# Patient Record
Sex: Male | Born: 1935 | Race: White | Hispanic: No | Marital: Married | Smoking: Never smoker
Health system: Southern US, Community
[De-identification: ages and names within clinical notes are randomized; demographics above are authoritative.]

## PROBLEM LIST (undated history)

## (undated) DIAGNOSIS — Z87442 Personal history of urinary calculi: Secondary | ICD-10-CM

## (undated) DIAGNOSIS — R011 Cardiac murmur, unspecified: Secondary | ICD-10-CM

## (undated) DIAGNOSIS — M1990 Unspecified osteoarthritis, unspecified site: Secondary | ICD-10-CM

## (undated) DIAGNOSIS — I10 Essential (primary) hypertension: Secondary | ICD-10-CM

## (undated) DIAGNOSIS — N400 Enlarged prostate without lower urinary tract symptoms: Secondary | ICD-10-CM

## (undated) HISTORY — PX: BACK SURGERY: SHX140

---

## 1998-03-19 ENCOUNTER — Encounter: Admission: RE | Admit: 1998-03-19 | Discharge: 1998-03-19 | Payer: Self-pay | Admitting: Infectious Diseases

## 1999-05-21 ENCOUNTER — Ambulatory Visit (HOSPITAL_COMMUNITY): Admission: RE | Admit: 1999-05-21 | Discharge: 1999-05-21 | Payer: Self-pay | Admitting: Internal Medicine

## 2000-01-22 ENCOUNTER — Ambulatory Visit (HOSPITAL_BASED_OUTPATIENT_CLINIC_OR_DEPARTMENT_OTHER): Admission: RE | Admit: 2000-01-22 | Discharge: 2000-01-22 | Payer: Self-pay | Admitting: Anesthesiology

## 2000-01-22 ENCOUNTER — Encounter (INDEPENDENT_AMBULATORY_CARE_PROVIDER_SITE_OTHER): Payer: Self-pay | Admitting: Specialist

## 2001-08-30 ENCOUNTER — Encounter: Admission: RE | Admit: 2001-08-30 | Discharge: 2001-08-30 | Payer: Self-pay | Admitting: Internal Medicine

## 2001-08-30 ENCOUNTER — Encounter: Payer: Self-pay | Admitting: Internal Medicine

## 2001-10-23 ENCOUNTER — Ambulatory Visit (HOSPITAL_COMMUNITY): Admission: RE | Admit: 2001-10-23 | Discharge: 2001-10-23 | Payer: Self-pay | Admitting: Internal Medicine

## 2001-10-30 ENCOUNTER — Encounter: Payer: Self-pay | Admitting: Internal Medicine

## 2001-10-30 ENCOUNTER — Encounter: Admission: RE | Admit: 2001-10-30 | Discharge: 2001-10-30 | Payer: Self-pay | Admitting: Internal Medicine

## 2001-11-16 ENCOUNTER — Ambulatory Visit (HOSPITAL_COMMUNITY): Admission: RE | Admit: 2001-11-16 | Discharge: 2001-11-16 | Payer: Self-pay | Admitting: Internal Medicine

## 2004-04-21 ENCOUNTER — Encounter: Admission: RE | Admit: 2004-04-21 | Discharge: 2004-04-21 | Payer: Self-pay | Admitting: Internal Medicine

## 2005-01-05 ENCOUNTER — Ambulatory Visit (HOSPITAL_COMMUNITY): Admission: RE | Admit: 2005-01-05 | Discharge: 2005-01-05 | Payer: Self-pay | Admitting: Gastroenterology

## 2008-09-23 ENCOUNTER — Emergency Department (HOSPITAL_COMMUNITY): Admission: EM | Admit: 2008-09-23 | Discharge: 2008-09-23 | Payer: Self-pay | Admitting: Emergency Medicine

## 2008-09-25 ENCOUNTER — Encounter: Admission: RE | Admit: 2008-09-25 | Discharge: 2008-09-25 | Payer: Self-pay | Admitting: Orthopedic Surgery

## 2008-10-16 ENCOUNTER — Ambulatory Visit (HOSPITAL_COMMUNITY): Admission: RE | Admit: 2008-10-16 | Discharge: 2008-10-16 | Payer: Self-pay | Admitting: Orthopedic Surgery

## 2008-10-21 ENCOUNTER — Ambulatory Visit (HOSPITAL_COMMUNITY): Admission: RE | Admit: 2008-10-21 | Discharge: 2008-10-21 | Payer: Self-pay | Admitting: Orthopedic Surgery

## 2009-05-19 ENCOUNTER — Ambulatory Visit (HOSPITAL_COMMUNITY): Admission: RE | Admit: 2009-05-19 | Discharge: 2009-05-19 | Payer: Self-pay | Admitting: General Surgery

## 2009-06-05 ENCOUNTER — Encounter (INDEPENDENT_AMBULATORY_CARE_PROVIDER_SITE_OTHER): Payer: Self-pay | Admitting: General Surgery

## 2009-06-05 ENCOUNTER — Ambulatory Visit (HOSPITAL_COMMUNITY): Admission: RE | Admit: 2009-06-05 | Discharge: 2009-06-05 | Payer: Self-pay | Admitting: General Surgery

## 2010-02-12 IMAGING — CR DG CHEST 2V
2 series · 2 of 2 positions shown · non-contrast
Comparison: None

CLINICAL DATA: Preop

CHEST - 2 VIEW

[view not recorded (1 of 2)]
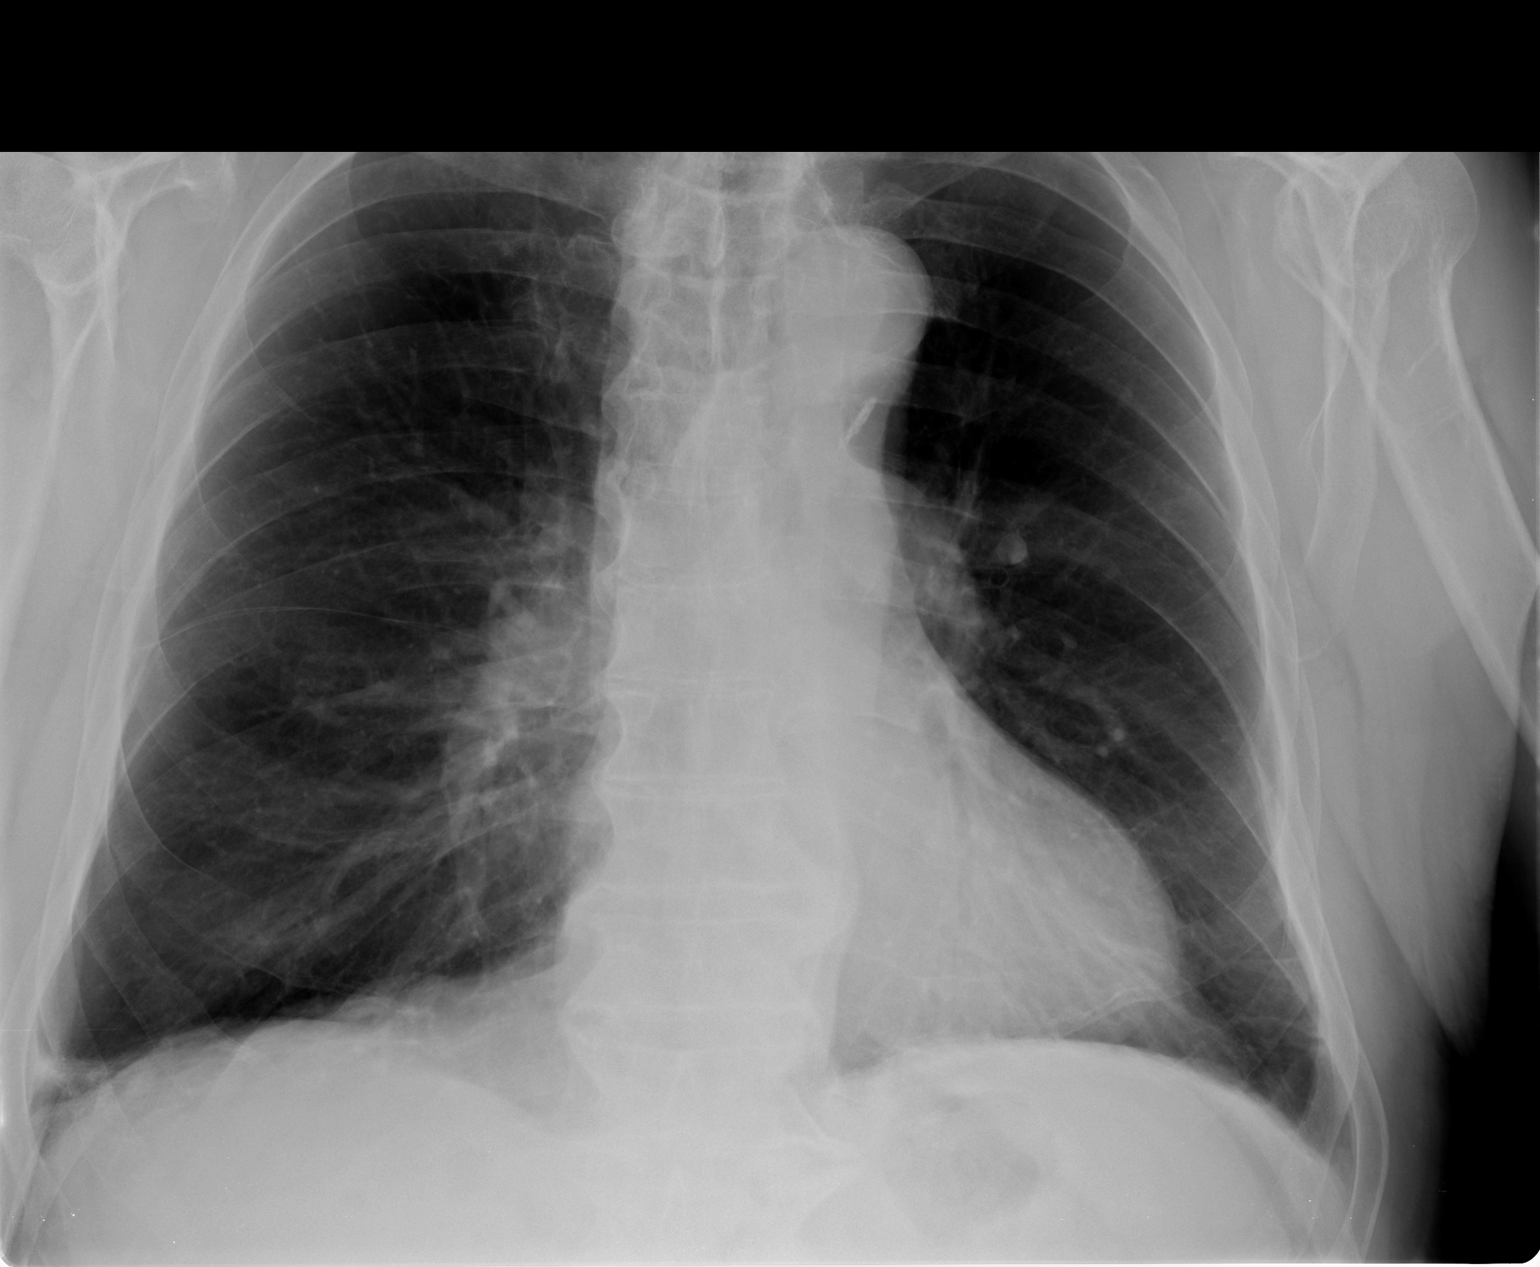

[view not recorded (2 of 2)]
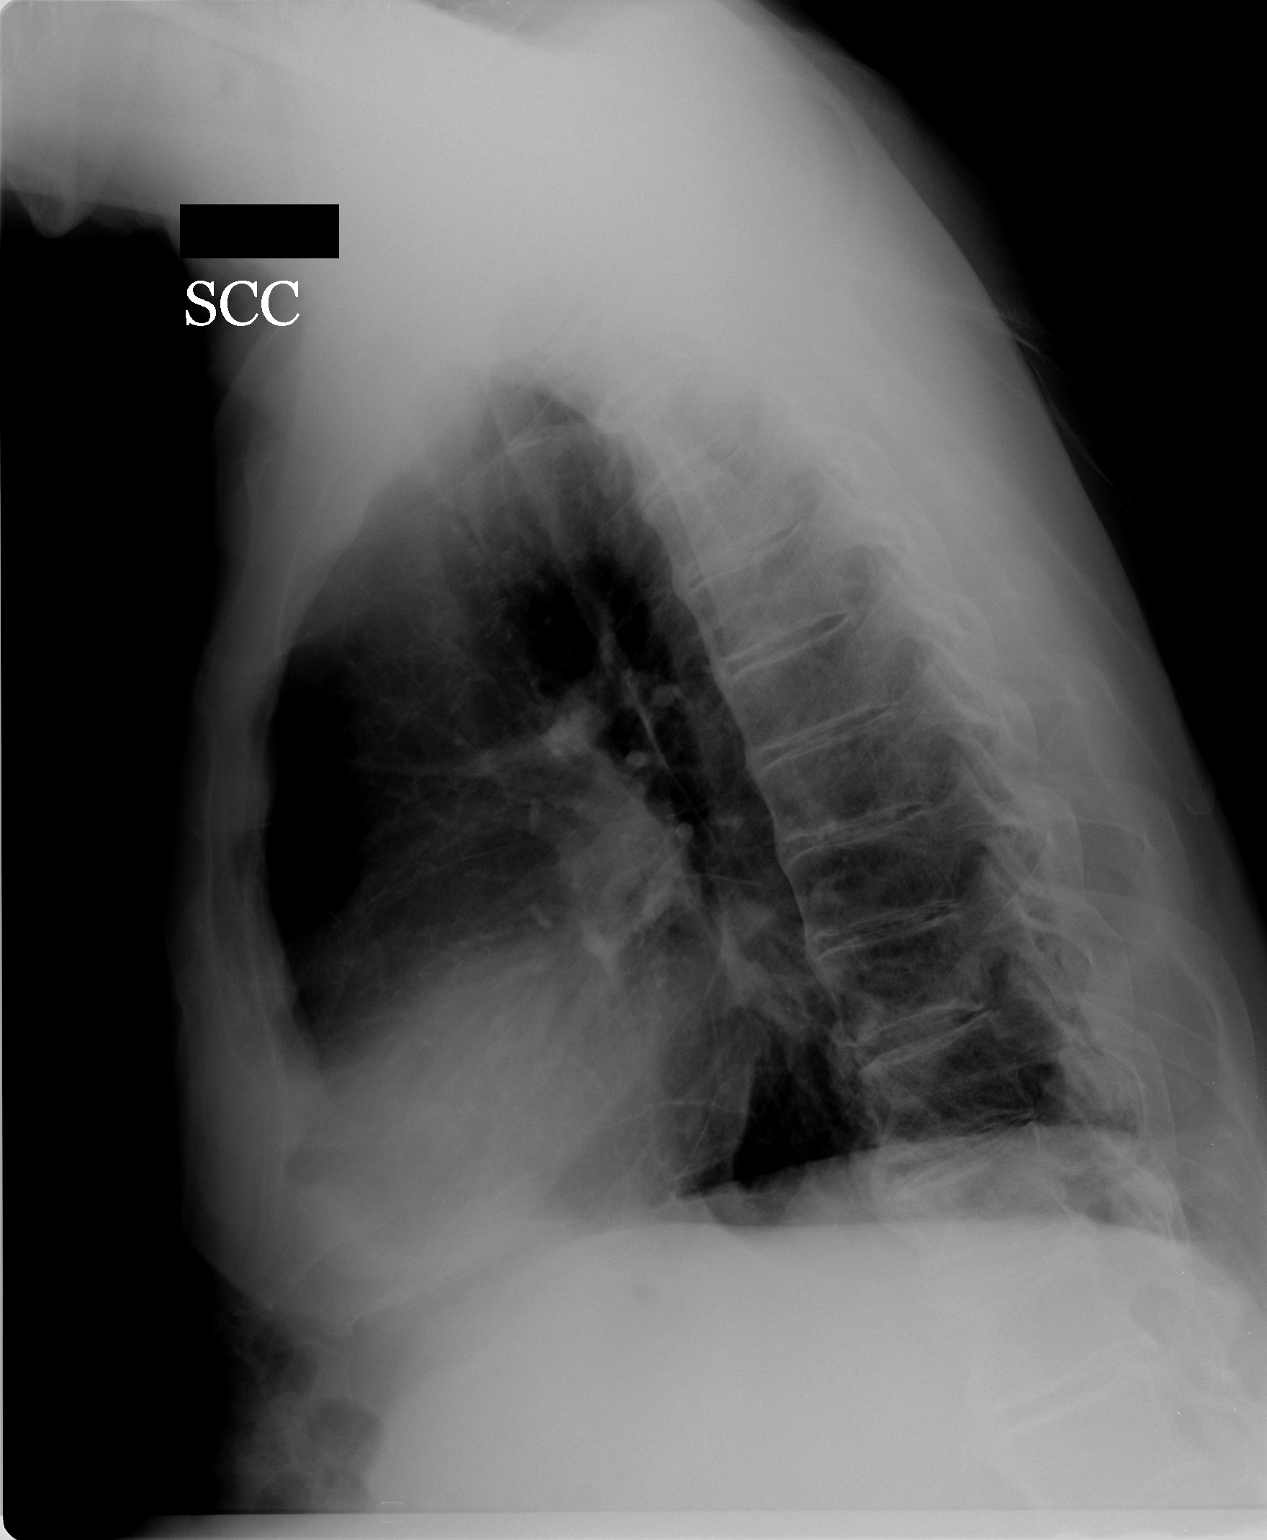

[2 of 2 positions shown; findings below may reference images not displayed]

FINDINGS: Cardiomediastinal silhouette is unremarkable.  No acute
infiltrate or pleural effusion.  No pulmonary edema.  Degenerative
changes thoracic spine.
IMPRESSION: No active disease.  Degenerative changes thoracic spine.

## 2010-09-06 ENCOUNTER — Encounter: Payer: Self-pay | Admitting: Orthopedic Surgery

## 2010-09-06 ENCOUNTER — Encounter: Payer: Self-pay | Admitting: Internal Medicine

## 2010-11-19 LAB — URINE MICROSCOPIC-ADD ON

## 2010-11-19 LAB — DIFFERENTIAL
Eosinophils Relative: 7 % — ABNORMAL HIGH (ref 0–5)
Lymphocytes Relative: 20 % (ref 12–46)
Lymphs Abs: 1.6 10*3/uL (ref 0.7–4.0)
Neutro Abs: 4.8 10*3/uL (ref 1.7–7.7)

## 2010-11-19 LAB — URINALYSIS, ROUTINE W REFLEX MICROSCOPIC
Glucose, UA: NEGATIVE mg/dL
Specific Gravity, Urine: 1.011 (ref 1.005–1.030)
pH: 7.5 (ref 5.0–8.0)

## 2010-11-19 LAB — CBC
MCHC: 34 g/dL (ref 30.0–36.0)
MCV: 88.8 fL (ref 78.0–100.0)
Platelets: 205 10*3/uL (ref 150–400)
RBC: 5.35 MIL/uL (ref 4.22–5.81)

## 2010-11-19 LAB — COMPREHENSIVE METABOLIC PANEL
AST: 23 U/L (ref 0–37)
CO2: 33 mEq/L — ABNORMAL HIGH (ref 19–32)
Calcium: 9.3 mg/dL (ref 8.4–10.5)
Creatinine, Ser: 0.91 mg/dL (ref 0.4–1.5)
GFR calc Af Amer: >60 mL/min (ref >60)
GFR calc non Af Amer: >60 mL/min (ref >60)

## 2010-11-26 LAB — CREATININE, SERUM
Creatinine, Ser: 0.85 mg/dL (ref 0.4–1.5)
GFR calc Af Amer: >60 mL/min (ref >60)

## 2011-01-01 NOTE — Op Note (Signed)
[place]  Patient:    Gilbert Morse, Gilbert Morse                     MRN: [id number] Proc. Date: 01/22/00 Adm. Date:  [id number] Disc. Date: [id number] Attending:  Melody Comas.                           Operative Report  PREOPERATIVE DIAGNOSIS:  A 1.5 cm nodular growth of the left palm (probable pyogenic granuloma).  POSTOPERATIVE DIAGNOSIS:  A 1.5 cm nodular growth of the left palm (probable pyogenic granuloma).  OPERATION PERFORMED:  Excisional biopsy of 1.5 cm nodular mass of the left palm.  SURGEON:  Janet Berlin. Dan Humphreys, M.D.  ANESTHESIA:  Local.  INDICATIONS FOR PROCEDURE:  Gilbert Morse is a 77 year old man who sustained a rather trivial episode of trauma to his hand.  The wound very quickly developed exuberant growth of what appears to be granulation tissue, consistent with a pyogenic granuloma.  This has markedly interfered with his ability to use his hand.  He was taken to the operating room at this time for removal of the lesion.  DESCRIPTION OF PROCEDURE:  The patient was brought to the minor surgery operating room in the [facility] and placed on the table in supine position with his arm extended out away from his side.  A padded tourniquet was applied to the forearm.  I first infiltrated the soft tissues of the palm around the lesion with 1% Xylocaine containing epinephrine.  The operative field was then vigorously scrubbed with Hibiclens solution and sterilely draped.  The tourniquet was then inflated to 250 mmHg where it remained up for approximately 10 minutes.  I first simply amputated the lesion through the base of its stalk.  The specimen was passed off the operative field for pathologic analysis.  I then excised the base of the lesion in fusiform fashion, full thickness.  The wound was copiously irrigated out with saline solution.  I then closed the wound with interrupted sutures of 4-0 nylon in both simple and  vertical mattress fashions.  The tourniquet was then deflated.  There was a little bit of oozing around the sutures which was controlled with digital pressure for a couple of minutes.  I then dressed the wound with bacitracin ointment, a small piece of Xeroform gauze, surgical fluff gauze and a Kling wrap.  The patient was given detailed wound care instructions.  I will see him in the office next week for wound check, suture removal and review of his final pathology report.  He was also given a prescription for Keflex 250 mg p.o. qid x 5 days. DD:  01/22/00 TD:  01/26/00 Job: [id number]

## 2011-01-01 NOTE — Op Note (Signed)
NAME:  Gilbert Morse, Gilbert Morse NO.:  [id number]   MEDICAL RECORD NO.:  [id number]          PATIENT TYPE:  AMB   LOCATION:  ENDO                         FACILITY:  [facility]   PHYSICIAN:  Danise Edge, M.D.   DATE OF BIRTH:  Apr 11, 1931   DATE OF PROCEDURE:  01/05/2005  DATE OF DISCHARGE:                                 OPERATIVE REPORT   PROCEDURE:  Colonoscopy.   INDICATIONS:  Mr. Gilbert Morse is a 77 year old male born [birth year].  Mr. Gilbert Morse is scheduled to undergo his first screening colonoscopy with  polypectomy to prevent colon cancer.   ENDOSCOPIST:  Danise Edge, M.D.   PREMEDICATION:  Versed 6 mg, Demerol 60 mg.   DESCRIPTION OF PROCEDURE:  After obtaining informed consent, Mr. Gilbert Morse was  placed in the left lateral decubitus position. I administered intravenous  Demerol and intravenous Versed to achieve conscious sedation for the  procedure. The patient's blood pressure, oxygen saturation and cardiac  rhythm were monitored throughout the procedure and documented in the medical  record.   Anal inspection was normal. Digital rectal exam reveals a nonnodular  prostate. The Olympus adjustable pediatric colonoscope was introduced into  the rectum and advanced to the cecum. A normal-appearing appendiceal orifice  and ileocecal valve were identified. Colonic preparation for the exam today  was satisfactory.   Mr. Gilbert Morse has universal colonic diverticulosis without diverticulitis or  diverticular stricture formation.   RECTUM:  Normal. Retroflexed view of the distal rectum normal.  SIGMOID COLON AND DESCENDING COLON:  Normal.  SPLENIC FLEXURE:  Normal.  TRANSVERSE COLON:  Normal.  HEPATIC FLEXURE:  Normal.  ASCENDING COLON:  Normal.  CECUM AND ILEOCECAL VALVE:  Normal.   ASSESSMENT:  Normal screening proctocolonoscopy to the cecum. No endoscopic  evidence for the presence of colorectal neoplasia. Universal colonic  diverticulosis noted.      MJ/MEDQ  D:  01/05/2005  T:  01/05/2005  Job:  [id number]   cc:   Erskine Speed, M.D.  [address], [address]  [place]  [place] [address]  Fax: [phone]

## 2011-11-17 ENCOUNTER — Other Ambulatory Visit (HOSPITAL_COMMUNITY): Payer: Self-pay | Admitting: Internal Medicine

## 2011-11-17 DIAGNOSIS — R079 Chest pain, unspecified: Secondary | ICD-10-CM

## 2011-11-22 ENCOUNTER — Ambulatory Visit (HOSPITAL_COMMUNITY): Payer: Medicare Other | Attending: Cardiology | Admitting: Radiology

## 2011-11-22 VITALS — BP 176/86 | Ht 72.0 in | Wt 194.0 lb

## 2011-11-22 DIAGNOSIS — R0602 Shortness of breath: Secondary | ICD-10-CM | POA: Insufficient documentation

## 2011-11-22 DIAGNOSIS — R002 Palpitations: Secondary | ICD-10-CM | POA: Insufficient documentation

## 2011-11-22 DIAGNOSIS — I10 Essential (primary) hypertension: Secondary | ICD-10-CM | POA: Insufficient documentation

## 2011-11-22 DIAGNOSIS — R079 Chest pain, unspecified: Secondary | ICD-10-CM | POA: Insufficient documentation

## 2011-11-22 MED ORDER — REGADENOSON 0.4 MG/5ML IV SOLN
0.4000 mg | Freq: Once | INTRAVENOUS | Status: AC
  Administered 2011-11-22: 0.4 mg via INTRAVENOUS

## 2011-11-22 MED ORDER — TECHNETIUM TC 99M TETROFOSMIN IV KIT
10.0000 | PACK | Freq: Once | INTRAVENOUS | Status: AC | PRN
  Administered 2011-11-22: 10 via INTRAVENOUS

## 2011-11-22 MED ORDER — TECHNETIUM TC 99M TETROFOSMIN IV KIT
30.0000 | PACK | Freq: Once | INTRAVENOUS | Status: AC | PRN
  Administered 2011-11-22: 30 via INTRAVENOUS

## 2011-11-22 NOTE — Progress Notes (Signed)
[facility] SITE 3 NUCLEAR MED [address] [place] [address] [phone]  Cardiology Nuclear Med Study  Gilbert Morse is a 78 y.o. male     MRN : [id number]     DOB: Sep 03, 1930  Procedure Date: 11/22/2011  Nuclear Med Background Indication for Stress Test:  Evaluation for Ischemia History: 2003: ECHO: EF: 55-65% Cardiac Risk Factors: Hypertension  Symptoms:  Chest Pain, Palpitations and SOB   Nuclear Pre-Procedure Caffeine/Decaff Intake:  None NPO After: 9:30pm   Lungs:  clear O2 Sat: 95% on room air. IV 0.9% NS with Angio Cath:  20g  IV Site: R Antecubital  IV Started by:  Stanton Kidney, EMT-P  Chest Size (in):  42 Cup Size: n/a  Height: 6' (1.829 m)  Weight:  194 lb (87.998 kg)  BMI:  Body mass index is 26.31 kg/(m^2). Tech Comments:  Coreg held > 24 hours, per patient.    Nuclear Med Study 1 or 2 day study: 1 day  Stress Test Type:  Eugenie Birks  Reading MD: Willa Rough, MD  Order Authorizing Provider:  ED GREEN MD  Resting Radionuclide: Technetium 89m Tetrofosmin  Resting Radionuclide Dose: 11.0 mCi   Stress Radionuclide:  Technetium 50m Tetrofosmin  Stress Radionuclide Dose: 32.9 mCi           Stress Protocol Rest HR: 57 Stress HR: 75  Rest BP: 176/86 Stress BP: 151/83  Exercise Time (min): n/a METS: n/a   Predicted Max HR: 142 bpm % Max HR: 52.82 bpm Rate Pressure Product: 41324   Dose of Adenosine (mg):  n/a Dose of Lexiscan: 0.4 mg  Dose of Atropine (mg): n/a Dose of Dobutamine: n/a mcg/kg/min (at max HR)  Stress Test Technologist: Milana Na, EMT-P  Nuclear Technologist:  Domenic Polite, CNMT     Rest Procedure:  Myocardial perfusion imaging was performed at rest 45 minutes following the intravenous administration of Technetium 13m Tetrofosmin. Rest ECG: NSR - Normal EKG  Stress Procedure:  The patient received IV Lexiscan 0.4 mg over 15-seconds.  Technetium 55m Tetrofosmin injected at 30-seconds.  There were no significant  changes and rare pacs with Lexiscan.  Quantitative spect images were obtained after a 45 minute delay. Stress ECG: No significant change from baseline ECG and There are scattered PACs.  QPS Raw Data Images:  Patient motion noted; appropriate software correction applied. Stress Images:  Normal homogeneous uptake in all areas of the myocardium. Rest Images:  Normal homogeneous uptake in all areas of the myocardium. Subtraction (SDS):  No evidence of ischemia. Transient Ischemic Dilatation (Normal <1.22):  0.97 Lung/Heart Ratio (Normal <0.45):  0.31  Quantitative Gated Spect Images QGS EDV:  141 ml QGS ESV:  57 ml  Impression Exercise Capacity:  Lexiscan with no exercise. BP Response:  Normal blood pressure response. Clinical Symptoms:  SOB ECG Impression:  No significant ST segment change suggestive of ischemia. Comparison with Prior Nuclear Study: No previous nuclear study performed  Overall Impression:  Normal stress nuclear study.  LV Ejection Fraction: 59%.  LV Wall Motion:  Normal Wall Motion  Willa Rough, MD

## 2012-08-31 ENCOUNTER — Other Ambulatory Visit: Payer: Self-pay | Admitting: Dermatology

## 2013-02-08 ENCOUNTER — Other Ambulatory Visit: Payer: Self-pay

## 2013-04-12 ENCOUNTER — Other Ambulatory Visit: Payer: Self-pay | Admitting: Dermatology

## 2014-07-03 ENCOUNTER — Other Ambulatory Visit: Payer: Self-pay | Admitting: Dermatology

## 2014-11-25 ENCOUNTER — Other Ambulatory Visit: Payer: Self-pay | Admitting: Dermatology

## 2016-03-19 DIAGNOSIS — H6123 Impacted cerumen, bilateral: Secondary | ICD-10-CM | POA: Insufficient documentation

## 2016-03-19 DIAGNOSIS — H9113 Presbycusis, bilateral: Secondary | ICD-10-CM | POA: Insufficient documentation

## 2019-02-26 ENCOUNTER — Other Ambulatory Visit: Payer: Self-pay | Admitting: Orthopedic Surgery

## 2019-05-17 ENCOUNTER — Other Ambulatory Visit: Payer: Self-pay

## 2019-05-17 ENCOUNTER — Other Ambulatory Visit (HOSPITAL_COMMUNITY): Payer: Self-pay | Admitting: Orthopedic Surgery

## 2019-05-17 ENCOUNTER — Ambulatory Visit (HOSPITAL_COMMUNITY)
Admission: RE | Admit: 2019-05-17 | Discharge: 2019-05-17 | Disposition: A | Discharge status: Home / Self Care | Payer: Medicare Other | Source: Ambulatory Visit | Attending: Orthopedic Surgery | Admitting: Orthopedic Surgery

## 2019-05-17 DIAGNOSIS — M79604 Pain in right leg: Secondary | ICD-10-CM | POA: Diagnosis not present

## 2019-05-17 DIAGNOSIS — M7989 Other specified soft tissue disorders: Secondary | ICD-10-CM

## 2019-05-17 NOTE — Progress Notes (Signed)
Right lower extremity venous duplex has been completed. Preliminary results can be found in CV Proc through chart review.  Results were given to [facility] Dr. Damita Dunnings office.  05/17/19 11:15 AM Carlos Levering RVT

## 2019-05-31 ENCOUNTER — Other Ambulatory Visit: Payer: Self-pay | Admitting: Orthopedic Surgery

## 2019-06-26 ENCOUNTER — Other Ambulatory Visit: Payer: Self-pay | Admitting: Orthopedic Surgery

## 2019-06-26 NOTE — Care Plan (Signed)
Spoke with patient and wife prior to surgery. Planning to discharge to home with HHPT. Referral to [facility] at [facility]. Has equipment at home. He will transition to OPPT after follow up with MD in the office. Patient and MD in agreement with plan. Choice offered.    Ladell Heads, [place]

## 2019-06-27 ENCOUNTER — Ambulatory Visit (HOSPITAL_COMMUNITY)
Admission: RE | Admit: 2019-06-27 | Discharge: 2019-06-27 | Disposition: A | Discharge status: Home / Self Care | Payer: Medicare Other | Source: Ambulatory Visit | Attending: Orthopedic Surgery | Admitting: Orthopedic Surgery

## 2019-06-27 ENCOUNTER — Other Ambulatory Visit: Payer: Self-pay

## 2019-06-27 ENCOUNTER — Encounter (HOSPITAL_COMMUNITY)
Admission: RE | Admit: 2019-06-27 | Discharge: 2019-06-27 | Disposition: A | Discharge status: Home / Self Care | Payer: Medicare Other | Source: Ambulatory Visit | Attending: Orthopedic Surgery | Admitting: Orthopedic Surgery

## 2019-06-27 ENCOUNTER — Encounter (HOSPITAL_COMMUNITY): Payer: Self-pay

## 2019-06-27 DIAGNOSIS — I700 Atherosclerosis of aorta: Secondary | ICD-10-CM | POA: Diagnosis not present

## 2019-06-27 DIAGNOSIS — M1712 Unilateral primary osteoarthritis, left knee: Secondary | ICD-10-CM | POA: Diagnosis not present

## 2019-06-27 DIAGNOSIS — Z01818 Encounter for other preprocedural examination: Secondary | ICD-10-CM | POA: Insufficient documentation

## 2019-06-27 DIAGNOSIS — J984 Other disorders of lung: Secondary | ICD-10-CM | POA: Insufficient documentation

## 2019-06-27 DIAGNOSIS — W19XXXA Unspecified fall, initial encounter: Secondary | ICD-10-CM

## 2019-06-27 HISTORY — DX: Personal history of urinary calculi: Z87.442

## 2019-06-27 HISTORY — DX: Essential (primary) hypertension: I10

## 2019-06-27 HISTORY — DX: Unspecified osteoarthritis, unspecified site: M19.90

## 2019-06-27 HISTORY — DX: Unspecified fall, initial encounter: W19.XXXA

## 2019-06-27 HISTORY — DX: Cardiac murmur, unspecified: R01.1

## 2019-06-27 HISTORY — DX: Benign prostatic hyperplasia without lower urinary tract symptoms: N40.0

## 2019-06-27 LAB — URINALYSIS, ROUTINE W REFLEX MICROSCOPIC
Bilirubin Urine: NEGATIVE
Glucose, UA: NEGATIVE mg/dL
Hgb urine dipstick: NEGATIVE
Ketones, ur: NEGATIVE mg/dL
Leukocytes,Ua: NEGATIVE
Nitrite: NEGATIVE
Protein, ur: NEGATIVE mg/dL
Specific Gravity, Urine: 1.016 (ref 1.005–1.030)
pH: 5 (ref 5.0–8.0)

## 2019-06-27 LAB — CBC WITH DIFFERENTIAL/PLATELET
Abs Immature Granulocytes: 0.11 10*3/uL — ABNORMAL HIGH (ref 0.00–0.07)
Basophils Absolute: 0.1 10*3/uL (ref 0.0–0.1)
Basophils Relative: 1 %
Eosinophils Absolute: 0.4 10*3/uL (ref 0.0–0.5)
Eosinophils Relative: 3 %
HCT: 42.4 % (ref 39.0–52.0)
Hemoglobin: 13.1 g/dL (ref 13.0–17.0)
Immature Granulocytes: 1 %
Lymphocytes Relative: 11 %
Lymphs Abs: 1.1 10*3/uL (ref 0.7–4.0)
MCH: 28.2 pg (ref 26.0–34.0)
MCHC: 30.9 g/dL (ref 30.0–36.0)
MCV: 91.4 fL (ref 80.0–100.0)
Monocytes Absolute: 1.2 10*3/uL — ABNORMAL HIGH (ref 0.1–1.0)
Monocytes Relative: 11 %
Neutro Abs: 7.9 10*3/uL — ABNORMAL HIGH (ref 1.7–7.7)
Neutrophils Relative %: 73 %
Platelets: 234 10*3/uL (ref 150–400)
RBC: 4.64 MIL/uL (ref 4.22–5.81)
RDW: 15.1 % (ref 11.5–15.5)
WBC: 10.8 10*3/uL — ABNORMAL HIGH (ref 4.0–10.5)
nRBC: 0 % (ref 0.0–0.2)

## 2019-06-27 LAB — BASIC METABOLIC PANEL
Anion gap: 7 (ref 5–15)
BUN: 40 mg/dL — ABNORMAL HIGH (ref 8–23)
CO2: 25 mmol/L (ref 22–32)
Calcium: 9.1 mg/dL (ref 8.9–10.3)
Chloride: 103 mmol/L (ref 98–111)
Creatinine, Ser: 1.29 mg/dL — ABNORMAL HIGH (ref 0.61–1.24)
GFR calc Af Amer: 58 mL/min — ABNORMAL LOW (ref >60)
GFR calc non Af Amer: 50 mL/min — ABNORMAL LOW (ref >60)
Glucose, Bld: 99 mg/dL (ref 70–99)
Potassium: 5.1 mmol/L (ref 3.5–5.1)
Sodium: 135 mmol/L (ref 135–145)

## 2019-06-27 LAB — APTT: aPTT: 28 seconds (ref 24–36)

## 2019-06-27 LAB — ABO/RH: ABO/RH(D): O POS

## 2019-06-27 LAB — PROTIME-INR
INR: 1 (ref 0.8–1.2)
Prothrombin Time: 13.3 seconds (ref 11.4–15.2)

## 2019-06-27 LAB — SURGICAL PCR SCREEN
MRSA, PCR: NEGATIVE
Staphylococcus aureus: NEGATIVE

## 2019-06-27 NOTE — Progress Notes (Addendum)
PCP - Gilbert Erp, MD Cardiologist -    Chest x-ray - done today  EKG - requested from [facility] at  Dr Mayer Camel office via VMM Stress Test -  ECHO -  Cardiac Cath -   Sleep Study -  CPAP -   Fasting Blood Sugar -  Checks Blood Sugar _____ times a day  Blood Thinner Instructions: Aspirin Instructions: Last Dose:  Anesthesia review:   Patient provided medical and surgical history verbally. Hx of HTN and heart murmur, reports heart murmur was heard when he was a young man , but has never caused him problems. He denies any other acute or chronic cardiac symptoms, disease or conditions. Reports his severe knee arthritis seriously limits his mobility. EKG and surgical clearance (if any ) requested from [facility] at  Dr Mayer Camel office via VMM   Patient denies shortness of breath, fever, cough and chest pain at PAT appointment   Patient verbalized understanding of instructions that were given to them at the PAT appointment. Patient was also instructed that they will need to review over the PAT instructions again at home before surgery.    11-12 10:10AM.  BUN 40, routed to surgeon Also , No reply nor fax received from [facility] [place] office. RN contacted PCP office and rrquested LOV , EKG, and surgical clearance   11-12 12:48PM EKG 05-03-2019 , LOV notes 05-03-2019 with Dr Gilbert Morse received and placed on patients chart . PA to review .

## 2019-06-27 NOTE — Patient Instructions (Addendum)
DUE TO COVID-19 ONLY ONE VISITOR IS ALLOWED TO COME WITH YOU AND STAY IN THE WAITING ROOM ONLY DURING PRE OP AND PROCEDURE DAY OF SURGERY. THE 1 VISITOR MAY VISIT WITH YOU AFTER SURGERY IN YOUR PRIVATE ROOM DURING VISITING HOURS ONLY!  YOU NEED TO HAVE A COVID 19 TEST ON___11-12____ @___2 :00PM____, THIS TEST MUST BE DONE BEFORE SURGERY, COME  [address], [place] , .  [facility]) ONCE YOUR COVID TEST IS COMPLETED, PLEASE BEGIN THE QUARANTINE INSTRUCTIONS AS OUTLINED IN YOUR HANDOUT.                Gilbert Morse   Your procedure is scheduled on: 07-02-2019   Report to [facility] Main  Entrance   Report to SHORT STAY at 5:30AM     Call this number if you have problems the morning of surgery [phone]    Do not eat food After Midnight. YOU MAY HAVE CLEAR LIQUIDS FROM MIDNIGHT UNTIL 4:30AM. At 4:30AM Please finish the prescribed Pre-Surgery ENSURE drink. Nothing by mouth after you finish the ENSURE drink !   CLEAR LIQUID DIET   Foods Allowed                                                                     Foods Excluded  Coffee and tea, regular and decaf                             liquids that you cannot  Plain Jell-O any favor except red or purple                                           see through such as: Fruit ices (not with fruit pulp)                                     milk, soups, orange juice  Iced Popsicles                                    All solid food Carbonated beverages, regular and diet                                    Cranberry, grape and apple juices Sports drinks like Gatorade Lightly seasoned clear broth or consume(fat free) Sugar, honey syrup  Sample Menu Breakfast                                Lunch                                     Supper Cranberry juice  Beef broth                            Chicken broth Jell-O                                     Grape juice                            Apple juice Coffee or tea                        Jell-O                                      Popsicle                                                Coffee or tea                        Coffee or tea  _____________________________________________________________________   BRUSH YOUR TEETH MORNING OF SURGERY AND RINSE YOUR MOUTH OUT, NO CHEWING GUM CANDY OR MINTS.     Take these medicines the morning of surgery with A SIP OF WATER: CARVEDILOL, DILTIAZEM                                  You may not have any metal on your body including hair pins and              piercings  Do not wear jewelry, make-up, lotions, powders or perfumes, deodorant              Men may shave face and neck.   Do not bring valuables to the hospital. [place].  Contacts, dentures or bridgework may not be worn into surgery.  YOU MAY BRING A SMALL OVERNIGHT BAG                   Please read over the following fact sheets you were given: _____________________________________________________________________             [facility] - Preparing for Surgery Before surgery, you can play an important role.  Because skin is not sterile, your skin needs to be as free of germs as possible.  You can reduce the number of germs on your skin by washing with CHG (chlorahexidine gluconate) soap before surgery.  CHG is an antiseptic cleaner which kills germs and bonds with the skin to continue killing germs even after washing. Please DO NOT use if you have an allergy to CHG or antibacterial soaps.  If your skin becomes reddened/irritated stop using the CHG and inform your nurse when you arrive at Short Stay. Do not shave (including legs and underarms) for at least 48 hours prior to the first CHG shower.  You may shave your face/neck. Please follow these instructions carefully:  1.  Shower with CHG  Soap the night before surgery and the  morning of Surgery.  2.  If you  choose to wash your hair, wash your hair first as usual with your  normal  shampoo.  3.  After you shampoo, rinse your hair and body thoroughly to remove the  shampoo.                           4.  Use CHG as you would any other liquid soap.  You can apply chg directly  to the skin and wash                       Gently with a scrungie or clean washcloth.  5.  Apply the CHG Soap to your body ONLY FROM THE NECK DOWN.   Do not use on face/ open                           Wound or open sores. Avoid contact with eyes, ears mouth and genitals (private parts).                       Wash face,  Genitals (private parts) with your normal soap.             6.  Wash thoroughly, paying special attention to the area where your surgery  will be performed.  7.  Thoroughly rinse your body with warm water from the neck down.  8.  DO NOT shower/wash with your normal soap after using and rinsing off  the CHG Soap.                9.  Pat yourself dry with a clean towel.            10.  Wear clean pajamas.            11.  Place clean sheets on your bed the night of your first shower and do not  sleep with pets. Day of Surgery : Do not apply any lotions/deodorants the morning of surgery.  Please wear clean clothes to the hospital/surgery center.  FAILURE TO FOLLOW THESE INSTRUCTIONS MAY RESULT IN THE CANCELLATION OF YOUR SURGERY PATIENT SIGNATURE_________________________________  NURSE SIGNATURE__________________________________  ________________________________________________________________________   Gilbert Morse  An incentive spirometer is a tool that can help keep your lungs clear and active. This tool measures how well you are filling your lungs with each breath. Taking long deep breaths may help reverse or decrease the chance of developing breathing (pulmonary) problems (especially infection) following:  A long period of time when you are unable to move or be active. BEFORE THE PROCEDURE   If  the spirometer includes an indicator to show your best effort, your nurse or respiratory therapist will set it to a desired goal.  If possible, sit up straight or lean slightly forward. Try not to slouch.  Hold the incentive spirometer in an upright position. INSTRUCTIONS FOR USE  1. Sit on the edge of your bed if possible, or sit up as far as you can in bed or on a chair. 2. Hold the incentive spirometer in an upright position. 3. Breathe out normally. 4. Place the mouthpiece in your mouth and seal your lips tightly around it. 5. Breathe in slowly and as deeply as possible, raising the piston or the ball toward the top of the column. 6. Hold your breath  for 3-5 seconds or for as long as possible. Allow the piston or ball to fall to the bottom of the column. 7. Remove the mouthpiece from your mouth and breathe out normally. 8. Rest for a few seconds and repeat Steps 1 through 7 at least 10 times every 1-2 hours when you are awake. Take your time and take a few normal breaths between deep breaths. 9. The spirometer may include an indicator to show your best effort. Use the indicator as a goal to work toward during each repetition. 10. After each set of 10 deep breaths, practice coughing to be sure your lungs are clear. If you have an incision (the cut made at the time of surgery), support your incision when coughing by placing a pillow or rolled up towels firmly against it. Once you are able to get out of bed, walk around indoors and cough well. You may stop using the incentive spirometer when instructed by your caregiver.  RISKS AND COMPLICATIONS  Take your time so you do not get dizzy or light-headed.  If you are in pain, you may need to take or ask for pain medication before doing incentive spirometry. It is harder to take a deep breath if you are having pain. AFTER USE  Rest and breathe slowly and easily.  It can be helpful to keep track of a log of your progress. Your caregiver can  provide you with a simple table to help with this. If you are using the spirometer at home, follow these instructions: SEEK MEDICAL CARE IF:   You are having difficultly using the spirometer.  You have trouble using the spirometer as often as instructed.  Your pain medication is not giving enough relief while using the spirometer.  You develop fever of 100.5 F (38.1 C) or higher. SEEK IMMEDIATE MEDICAL CARE IF:   You cough up bloody sputum that had not been present before.  You develop fever of 102 F (38.9 C) or greater.  You develop worsening pain at or near the incision site. MAKE SURE YOU:   Understand these instructions.  Will watch your condition.  Will get help right away if you are not doing well or get worse. Document Released: 12/13/2006 Document Revised: 10/25/2011 Document Reviewed: 02/13/2007 [facility] Patient Information 2014 [place], [place].   ________________________________________________________________________

## 2019-06-28 ENCOUNTER — Other Ambulatory Visit (HOSPITAL_COMMUNITY)
Admission: RE | Admit: 2019-06-28 | Discharge: 2019-06-28 | Disposition: A | Discharge status: Home / Self Care | Payer: Medicare Other | Source: Ambulatory Visit | Attending: Orthopedic Surgery | Admitting: Orthopedic Surgery

## 2019-06-28 DIAGNOSIS — Z01812 Encounter for preprocedural laboratory examination: Secondary | ICD-10-CM | POA: Diagnosis present

## 2019-06-28 DIAGNOSIS — Z20828 Contact with and (suspected) exposure to other viral communicable diseases: Secondary | ICD-10-CM | POA: Insufficient documentation

## 2019-06-28 DIAGNOSIS — M1712 Unilateral primary osteoarthritis, left knee: Secondary | ICD-10-CM | POA: Diagnosis present

## 2019-06-28 NOTE — H&P (Signed)
TOTAL KNEE ADMISSION H&P  Patient is being admitted for left total knee arthroplasty.  Subjective:  Chief Complaint:left knee pain.  HPI: Gilbert Morse, 85 y.o. male, has a history of pain and functional disability in the left knee due to arthritis and has failed non-surgical conservative treatments for greater than 12 weeks to includeNSAID's and/or analgesics, corticosteriod injections, use of assistive devices and activity modification.  Onset of symptoms was gradual, starting several years ago with gradually worsening course since that time. The patient noted no past surgery on the left knee(s).  Patient currently rates pain in the left knee(s) at 10 out of 10 with activity. Patient has night pain, worsening of pain with activity and weight bearing, pain that interferes with activities of daily living, pain with passive range of motion, crepitus and joint swelling.  Patient has evidence of subchondral sclerosis, periarticular osteophytes and joint space narrowing by imaging studies.  There is no active infection.  There are no active problems to display for this patient.  Past Medical History:  Diagnosis Date  . Arthritis   . Enlarged prostate   . Falls 06/27/2019   last fall was 2 weeks ago   . Heart murmur   . History of kidney stones    only one as a young man , passed independently   . HTN (hypertension)     Past Surgical History:  Procedure Laterality Date  . BACK SURGERY     total 6 surgeries  . inluding discectomy, fusion with harvest from right hip", rods in place     No current facility-administered medications for this encounter.    Current Outpatient Medications  Medication Sig Dispense Refill Last Dose  . carvedilol (COREG) 25 MG tablet Take 25 mg by mouth 2 (two) times daily.     Marland Kitchen diltiazem (CARDIZEM CD) 300 MG 24 hr capsule Take 300 mg by mouth daily.     . finasteride (PROSCAR) 5 MG tablet Take 5 mg by mouth at bedtime.     . hydrochlorothiazide (HYDRODIURIL)  25 MG tablet Take 25 mg by mouth daily.     Marland Kitchen lisinopril (ZESTRIL) 40 MG tablet Take 40 mg by mouth daily.     . Multiple Vitamins-Minerals (IMMUNE SUPPORT VITAMIN C PO) Take 1 tablet by mouth daily. L'il critter     . spironolactone (ALDACTONE) 25 MG tablet Take 25 mg by mouth daily.     . tamsulosin (FLOMAX) 0.4 MG CAPS capsule Take 0.4 mg by mouth at bedtime.      No Known Allergies  Social History   Tobacco Use  . Smoking status: Never Smoker  . Smokeless tobacco: Never Used  Substance Use Topics  . Alcohol use: Yes    Comment: occ     No family history on file.   Review of Systems  Constitutional: Negative.   HENT: Positive for hearing loss.   Eyes: Negative.   Respiratory: Negative.   Cardiovascular: Negative.   Gastrointestinal: Negative.   Genitourinary: Negative.   Musculoskeletal: Positive for joint pain.  Skin: Negative.   Neurological: Negative.   Endo/Heme/Allergies: Bruises/bleeds easily.  Psychiatric/Behavioral: Negative.     Objective:  Physical Exam  Constitutional: He is oriented to person, place, and time. He appears well-developed and well-nourished.  HENT:  Head: Normocephalic and atraumatic.  Eyes: Pupils are equal, round, and reactive to light.  Neck: Normal range of motion. Neck supple.  Cardiovascular: Intact distal pulses.  Respiratory: Effort normal.  Musculoskeletal:  General: Tenderness present.     Comments: he has tenderness over the joint line of bilateral knees.    Neurological: He is alert and oriented to person, place, and time.  Skin: Skin is warm and dry.  Psychiatric: He has a normal mood and affect. His behavior is normal. Judgment and thought content normal.    Vital signs in last 24 hours: Temp:  [97.6 F (36.4 C)] 97.6 F (36.4 C) (11/11 1300) Pulse Rate:  [57] 57 (11/11 1300) Resp:  [18] 18 (11/11 1300) BP: (146)/(60) 146/60 (11/11 1300) SpO2:  [100 %] 100 % (11/11 1300) Weight:  [89.6 kg] 89.6 kg (11/11  1300)  Labs:   Estimated body mass index is 26.79 kg/m as calculated from the following:   Height as of 06/27/19: 6' (1.829 m).   Weight as of 06/27/19: 89.6 kg.   Imaging Review Plain radiographs demonstrate Severe osteoarthritis in both knees, left worse than right.      Assessment/Plan:  End stage arthritis, left knee   The patient history, physical examination, clinical judgment of the provider and imaging studies are consistent with end stage degenerative joint disease of the left knee(s) and total knee arthroplasty is deemed medically necessary. The treatment options including medical management, injection therapy arthroscopy and arthroplasty were discussed at length. The risks and benefits of total knee arthroplasty were presented and reviewed. The risks due to aseptic loosening, infection, stiffness, patella tracking problems, thromboembolic complications and other imponderables were discussed. The patient acknowledged the explanation, agreed to proceed with the plan and consent was signed. Patient is being admitted for inpatient treatment for surgery, pain control, PT, OT, prophylactic antibiotics, VTE prophylaxis, progressive ambulation and ADL's and discharge planning. The patient is planning to be discharged home with home health services     Patient's anticipated LOS is less than 2 midnights, meeting these requirements: - Younger than 73 - Lives within 1 hour of care - Has a competent adult at home to recover with post-op recover - NO history of  - Chronic pain requiring opiods  - Diabetes  - Coronary Artery Disease  - Heart failure  - Heart attack  - Stroke  - DVT/VTE  - Cardiac arrhythmia  - Respiratory Failure/COPD  - Renal failure  - Anemia  - Advanced Liver disease

## 2019-06-30 LAB — NOVEL CORONAVIRUS, NAA (HOSP ORDER, SEND-OUT TO REF LAB; TAT 18-24 HRS): SARS-CoV-2, NAA: NOT DETECTED

## 2019-07-01 MED ORDER — BUPIVACAINE LIPOSOME 1.3 % IJ SUSP
20.0000 mL | Freq: Once | INTRAMUSCULAR | Status: DC
  Filled 2019-07-01: qty 20

## 2019-07-01 MED ORDER — TRANEXAMIC ACID 1000 MG/10ML IV SOLN
2000.0000 mg | INTRAVENOUS | Status: DC
  Filled 2019-07-01: qty 20

## 2019-07-01 NOTE — Anesthesia Preprocedure Evaluation (Addendum)
Anesthesia Evaluation  Patient identified by MRN, date of birth, ID band Patient awake    Reviewed: Allergy & Precautions, NPO status , Patient's Chart, lab work & pertinent test results  History of Anesthesia Complications Negative for: history of anesthetic complications  Airway Mallampati: II  TM Distance: >3 FB Neck ROM: Full    Dental   Pulmonary neg pulmonary ROS,    Pulmonary exam normal        Cardiovascular hypertension, Pt. on home beta blockers and Pt. on medications Normal cardiovascular exam     Neuro/Psych negative neurological ROS  negative psych ROS   GI/Hepatic negative GI ROS, Neg liver ROS,   Endo/Other  negative endocrine ROS  Renal/GU negative Renal ROS  negative genitourinary   Musculoskeletal  (+) Arthritis , Osteoarthritis,    Abdominal   Peds  Hematology negative hematology ROS (+)   Anesthesia Other Findings Multiple back surgeries No AC, plts 234, INR 1.0  Reproductive/Obstetrics                            Anesthesia Physical Anesthesia Plan  ASA: II  Anesthesia Plan: Spinal   Post-op Pain Management:    Induction:   PONV Risk Score and Plan: 1 and Propofol infusion, Treatment may vary due to age or medical condition and TIVA  Airway Management Planned: Nasal Cannula and Simple Face Mask  Additional Equipment: None  Intra-op Plan:   Post-operative Plan:   Informed Consent: I have reviewed the patients History and Physical, chart, labs and discussed the procedure including the risks, benefits and alternatives for the proposed anesthesia with the patient or authorized representative who has indicated his/her understanding and acceptance.       Plan Discussed with:   Anesthesia Plan Comments:        Anesthesia Quick Evaluation

## 2019-07-02 ENCOUNTER — Ambulatory Visit (HOSPITAL_COMMUNITY): Payer: Medicare Other | Admitting: Anesthesiology

## 2019-07-02 ENCOUNTER — Inpatient Hospital Stay (HOSPITAL_COMMUNITY)
Admission: RE | Admit: 2019-07-02 | Discharge: 2019-07-10 | DRG: 470 | Disposition: A | Discharge status: Home Health | Payer: Medicare Other | Attending: Orthopedic Surgery | Admitting: Orthopedic Surgery

## 2019-07-02 ENCOUNTER — Encounter (HOSPITAL_COMMUNITY)
Admission: RE | Disposition: A | Discharge status: Home Health | Payer: Self-pay | Source: Home / Self Care | Attending: Orthopedic Surgery

## 2019-07-02 ENCOUNTER — Ambulatory Visit (HOSPITAL_COMMUNITY): Payer: Medicare Other | Admitting: Physician Assistant

## 2019-07-02 ENCOUNTER — Encounter (HOSPITAL_COMMUNITY): Payer: Self-pay

## 2019-07-02 ENCOUNTER — Other Ambulatory Visit: Payer: Self-pay

## 2019-07-02 DIAGNOSIS — D62 Acute posthemorrhagic anemia: Secondary | ICD-10-CM | POA: Diagnosis not present

## 2019-07-02 DIAGNOSIS — M1712 Unilateral primary osteoarthritis, left knee: Principal | ICD-10-CM | POA: Diagnosis present

## 2019-07-02 DIAGNOSIS — L8992 Pressure ulcer of unspecified site, stage 2: Secondary | ICD-10-CM | POA: Diagnosis present

## 2019-07-02 DIAGNOSIS — Z79899 Other long term (current) drug therapy: Secondary | ICD-10-CM

## 2019-07-02 DIAGNOSIS — N400 Enlarged prostate without lower urinary tract symptoms: Secondary | ICD-10-CM | POA: Diagnosis present

## 2019-07-02 DIAGNOSIS — Z96652 Presence of left artificial knee joint: Secondary | ICD-10-CM

## 2019-07-02 DIAGNOSIS — I10 Essential (primary) hypertension: Secondary | ICD-10-CM | POA: Diagnosis present

## 2019-07-02 DIAGNOSIS — L8990 Pressure ulcer of unspecified site, unspecified stage: Secondary | ICD-10-CM | POA: Insufficient documentation

## 2019-07-02 HISTORY — PX: TOTAL KNEE ARTHROPLASTY: SHX125

## 2019-07-02 LAB — TYPE AND SCREEN
ABO/RH(D): O POS
Antibody Screen: NEGATIVE

## 2019-07-02 SURGERY — ARTHROPLASTY, KNEE, TOTAL
Anesthesia: Spinal | Site: Knee | Laterality: Left

## 2019-07-02 MED ORDER — LISINOPRIL 20 MG PO TABS
40.0000 mg | ORAL_TABLET | Freq: Every day | ORAL | Status: DC
  Administered 2019-07-07 – 2019-07-10 (×3): 40 mg via ORAL
  Filled 2019-07-02 (×4): qty 2

## 2019-07-02 MED ORDER — PROPOFOL 500 MG/50ML IV EMUL
INTRAVENOUS | Status: DC | PRN
  Administered 2019-07-02: 50 ug/kg/min via INTRAVENOUS

## 2019-07-02 MED ORDER — OXYCODONE HCL 5 MG PO TABS
5.0000 mg | ORAL_TABLET | ORAL | Status: DC | PRN
  Administered 2019-07-02 – 2019-07-05 (×7): 5 mg via ORAL
  Filled 2019-07-02 (×6): qty 1

## 2019-07-02 MED ORDER — METOCLOPRAMIDE HCL 5 MG PO TABS: 5.0000 mg | ORAL_TABLET | Freq: Three times a day (TID) | ORAL | Status: DC | PRN

## 2019-07-02 MED ORDER — METOCLOPRAMIDE HCL 5 MG/ML IJ SOLN: 5.0000 mg | Freq: Three times a day (TID) | INTRAMUSCULAR | Status: DC | PRN

## 2019-07-02 MED ORDER — PANTOPRAZOLE SODIUM 40 MG PO TBEC
40.0000 mg | DELAYED_RELEASE_TABLET | Freq: Every day | ORAL | Status: DC
  Administered 2019-07-02 – 2019-07-10 (×9): 40 mg via ORAL
  Filled 2019-07-02 (×9): qty 1

## 2019-07-02 MED ORDER — FENTANYL CITRATE (PF) 100 MCG/2ML IJ SOLN
INTRAMUSCULAR | Status: AC
  Filled 2019-07-02: qty 2

## 2019-07-02 MED ORDER — MIDAZOLAM HCL 2 MG/2ML IJ SOLN
INTRAMUSCULAR | Status: AC
  Filled 2019-07-02: qty 2

## 2019-07-02 MED ORDER — CHLORHEXIDINE GLUCONATE 4 % EX LIQD: 60.0000 mL | Freq: Once | CUTANEOUS | Status: DC

## 2019-07-02 MED ORDER — ALUM & MAG HYDROXIDE-SIMETH 200-200-20 MG/5ML PO SUSP: 30.0000 mL | ORAL | Status: DC | PRN

## 2019-07-02 MED ORDER — ONDANSETRON HCL 4 MG/2ML IJ SOLN: 4.0000 mg | Freq: Once | INTRAMUSCULAR | Status: DC | PRN

## 2019-07-02 MED ORDER — LACTATED RINGERS IV SOLN
INTRAVENOUS | Status: DC
  Administered 2019-07-02: 06:00:00 via INTRAVENOUS

## 2019-07-02 MED ORDER — SODIUM CHLORIDE (PF) 0.9 % IJ SOLN
INTRAMUSCULAR | Status: AC
  Filled 2019-07-02: qty 50

## 2019-07-02 MED ORDER — HYDROCHLOROTHIAZIDE 25 MG PO TABS
25.0000 mg | ORAL_TABLET | Freq: Every day | ORAL | Status: DC
  Administered 2019-07-02 – 2019-07-10 (×7): 25 mg via ORAL
  Filled 2019-07-02 (×8): qty 1

## 2019-07-02 MED ORDER — FENTANYL CITRATE (PF) 100 MCG/2ML IJ SOLN
INTRAMUSCULAR | Status: DC | PRN
  Administered 2019-07-02: 50 ug via INTRAVENOUS
  Administered 2019-07-02: 25 ug via INTRAVENOUS

## 2019-07-02 MED ORDER — KCL IN DEXTROSE-NACL 20-5-0.45 MEQ/L-%-% IV SOLN: INTRAVENOUS | Status: DC

## 2019-07-02 MED ORDER — BUPIVACAINE LIPOSOME 1.3 % IJ SUSP
INTRAMUSCULAR | Status: DC | PRN
  Administered 2019-07-02: 20 mL

## 2019-07-02 MED ORDER — TAMSULOSIN HCL 0.4 MG PO CAPS
0.4000 mg | ORAL_CAPSULE | Freq: Every day | ORAL | Status: DC
  Administered 2019-07-02 – 2019-07-09 (×8): 0.4 mg via ORAL
  Filled 2019-07-02 (×9): qty 1

## 2019-07-02 MED ORDER — OXYCODONE HCL 5 MG PO TABS: 5.0000 mg | ORAL_TABLET | Freq: Once | ORAL | Status: DC | PRN

## 2019-07-02 MED ORDER — PHENOL 1.4 % MT LIQD: 1.0000 | OROMUCOSAL | Status: DC | PRN

## 2019-07-02 MED ORDER — SPIRONOLACTONE 25 MG PO TABS
25.0000 mg | ORAL_TABLET | Freq: Every day | ORAL | Status: DC
  Administered 2019-07-02 – 2019-07-10 (×6): 25 mg via ORAL
  Filled 2019-07-02 (×9): qty 1

## 2019-07-02 MED ORDER — CARVEDILOL 25 MG PO TABS
25.0000 mg | ORAL_TABLET | Freq: Two times a day (BID) | ORAL | Status: DC
  Administered 2019-07-02 – 2019-07-10 (×10): 25 mg via ORAL
  Filled 2019-07-02 (×12): qty 1

## 2019-07-02 MED ORDER — ASPIRIN EC 81 MG PO TBEC: 81.0000 mg | DELAYED_RELEASE_TABLET | Freq: Two times a day (BID) | ORAL | 0 refills | Status: DC

## 2019-07-02 MED ORDER — DILTIAZEM HCL ER COATED BEADS 180 MG PO CP24
300.0000 mg | ORAL_CAPSULE | Freq: Every day | ORAL | Status: DC
  Administered 2019-07-02 – 2019-07-10 (×9): 300 mg via ORAL
  Filled 2019-07-02 (×9): qty 1

## 2019-07-02 MED ORDER — GABAPENTIN 300 MG PO CAPS
300.0000 mg | ORAL_CAPSULE | Freq: Three times a day (TID) | ORAL | Status: DC
  Administered 2019-07-02 (×2): 300 mg via ORAL
  Filled 2019-07-02 (×2): qty 1

## 2019-07-02 MED ORDER — TRANEXAMIC ACID-NACL 1000-0.7 MG/100ML-% IV SOLN
1000.0000 mg | INTRAVENOUS | Status: AC
  Administered 2019-07-02: 1000 mg via INTRAVENOUS
  Filled 2019-07-02: qty 100

## 2019-07-02 MED ORDER — ONDANSETRON HCL 4 MG/2ML IJ SOLN
4.0000 mg | Freq: Four times a day (QID) | INTRAMUSCULAR | Status: DC | PRN
  Administered 2019-07-02: 4 mg via INTRAVENOUS
  Filled 2019-07-02: qty 2

## 2019-07-02 MED ORDER — DIPHENHYDRAMINE HCL 12.5 MG/5ML PO ELIX: 12.5000 mg | ORAL_SOLUTION | ORAL | Status: DC | PRN

## 2019-07-02 MED ORDER — BISACODYL 5 MG PO TBEC
5.0000 mg | DELAYED_RELEASE_TABLET | Freq: Every day | ORAL | Status: DC | PRN
  Administered 2019-07-06 – 2019-07-07 (×2): 5 mg via ORAL
  Filled 2019-07-02 (×2): qty 1

## 2019-07-02 MED ORDER — FINASTERIDE 5 MG PO TABS
5.0000 mg | ORAL_TABLET | Freq: Every day | ORAL | Status: DC
  Administered 2019-07-02 – 2019-07-09 (×8): 5 mg via ORAL
  Filled 2019-07-02 (×8): qty 1

## 2019-07-02 MED ORDER — CEFAZOLIN SODIUM-DEXTROSE 2-4 GM/100ML-% IV SOLN
2.0000 g | INTRAVENOUS | Status: AC
  Administered 2019-07-02: 2 g via INTRAVENOUS
  Filled 2019-07-02: qty 100

## 2019-07-02 MED ORDER — POLYETHYLENE GLYCOL 3350 17 G PO PACK
17.0000 g | PACK | Freq: Every day | ORAL | Status: DC | PRN
  Administered 2019-07-05 – 2019-07-07 (×3): 17 g via ORAL
  Filled 2019-07-02 (×3): qty 1

## 2019-07-02 MED ORDER — METHOCARBAMOL 500 MG IVPB - SIMPLE MED
500.0000 mg | Freq: Four times a day (QID) | INTRAVENOUS | Status: DC | PRN
  Administered 2019-07-02: 500 mg via INTRAVENOUS
  Filled 2019-07-02: qty 50

## 2019-07-02 MED ORDER — PROPOFOL 500 MG/50ML IV EMUL
INTRAVENOUS | Status: AC
  Filled 2019-07-02: qty 50

## 2019-07-02 MED ORDER — TIZANIDINE HCL 2 MG PO TABS: 2.0000 mg | ORAL_TABLET | Freq: Four times a day (QID) | ORAL | 0 refills | Status: DC | PRN

## 2019-07-02 MED ORDER — LIDOCAINE HCL (CARDIAC) PF 100 MG/5ML IV SOSY
PREFILLED_SYRINGE | INTRAVENOUS | Status: DC | PRN
  Administered 2019-07-02: 40 mg via INTRAVENOUS

## 2019-07-02 MED ORDER — SODIUM CHLORIDE 0.9 % IR SOLN
Status: DC | PRN
  Administered 2019-07-02: 1000 mL

## 2019-07-02 MED ORDER — SODIUM CHLORIDE (PF) 0.9 % IJ SOLN
INTRAMUSCULAR | Status: DC | PRN
  Administered 2019-07-02: 70 mL

## 2019-07-02 MED ORDER — BUPIVACAINE HCL (PF) 0.25 % IJ SOLN
INTRAMUSCULAR | Status: AC
  Filled 2019-07-02: qty 30

## 2019-07-02 MED ORDER — ONDANSETRON HCL 4 MG PO TABS: 4.0000 mg | ORAL_TABLET | Freq: Four times a day (QID) | ORAL | Status: DC | PRN

## 2019-07-02 MED ORDER — DEXAMETHASONE SODIUM PHOSPHATE 10 MG/ML IJ SOLN
INTRAMUSCULAR | Status: AC
  Filled 2019-07-02: qty 1

## 2019-07-02 MED ORDER — BUPIVACAINE HCL (PF) 0.25 % IJ SOLN
INTRAMUSCULAR | Status: DC | PRN
  Administered 2019-07-02: 30 mL

## 2019-07-02 MED ORDER — MENTHOL 3 MG MT LOZG: 1.0000 | LOZENGE | OROMUCOSAL | Status: DC | PRN

## 2019-07-02 MED ORDER — TRANEXAMIC ACID-NACL 1000-0.7 MG/100ML-% IV SOLN
1000.0000 mg | Freq: Once | INTRAVENOUS | Status: AC
  Administered 2019-07-02: 1000 mg via INTRAVENOUS
  Filled 2019-07-02: qty 100

## 2019-07-02 MED ORDER — ACETAMINOPHEN 325 MG PO TABS
325.0000 mg | ORAL_TABLET | Freq: Four times a day (QID) | ORAL | Status: DC | PRN
  Administered 2019-07-05 – 2019-07-10 (×12): 650 mg via ORAL
  Filled 2019-07-02 (×12): qty 2

## 2019-07-02 MED ORDER — WATER FOR IRRIGATION, STERILE IR SOLN
Status: DC | PRN
  Administered 2019-07-02: 2000 mL

## 2019-07-02 MED ORDER — 0.9 % SODIUM CHLORIDE (POUR BTL) OPTIME
TOPICAL | Status: DC | PRN
  Administered 2019-07-02: 1000 mL

## 2019-07-02 MED ORDER — FLEET ENEMA 7-19 GM/118ML RE ENEM: 1.0000 | ENEMA | Freq: Once | RECTAL | Status: DC | PRN

## 2019-07-02 MED ORDER — HYDROMORPHONE HCL 1 MG/ML IJ SOLN: 0.5000 mg | INTRAMUSCULAR | Status: DC | PRN

## 2019-07-02 MED ORDER — METHOCARBAMOL 500 MG PO TABS: 500.0000 mg | ORAL_TABLET | Freq: Four times a day (QID) | ORAL | Status: DC | PRN

## 2019-07-02 MED ORDER — EPHEDRINE 5 MG/ML INJ
INTRAVENOUS | Status: AC
  Filled 2019-07-02: qty 10

## 2019-07-02 MED ORDER — ASPIRIN 81 MG PO CHEW
81.0000 mg | CHEWABLE_TABLET | Freq: Two times a day (BID) | ORAL | Status: DC
  Administered 2019-07-02 – 2019-07-10 (×16): 81 mg via ORAL
  Filled 2019-07-02 (×16): qty 1

## 2019-07-02 MED ORDER — METHOCARBAMOL 500 MG IVPB - SIMPLE MED
INTRAVENOUS | Status: AC
  Filled 2019-07-02: qty 50

## 2019-07-02 MED ORDER — EPHEDRINE SULFATE 50 MG/ML IJ SOLN
INTRAMUSCULAR | Status: DC | PRN
  Administered 2019-07-02 (×2): 5 mg via INTRAVENOUS

## 2019-07-02 MED ORDER — MIDAZOLAM HCL 5 MG/5ML IJ SOLN
INTRAMUSCULAR | Status: DC | PRN
  Administered 2019-07-02: 1 mg via INTRAVENOUS

## 2019-07-02 MED ORDER — DEXTROSE-NACL 5-0.45 % IV SOLN
INTRAVENOUS | Status: DC
  Administered 2019-07-02 – 2019-07-05 (×7): via INTRAVENOUS

## 2019-07-02 MED ORDER — BUPIVACAINE IN DEXTROSE 0.75-8.25 % IT SOLN
INTRATHECAL | Status: DC | PRN
  Administered 2019-07-02: 1.6 mL via INTRATHECAL

## 2019-07-02 MED ORDER — ROPIVACAINE HCL 5 MG/ML IJ SOLN
INTRAMUSCULAR | Status: DC | PRN
  Administered 2019-07-02: 30 mL via PERINEURAL

## 2019-07-02 MED ORDER — DOCUSATE SODIUM 100 MG PO CAPS
100.0000 mg | ORAL_CAPSULE | Freq: Two times a day (BID) | ORAL | Status: DC
  Administered 2019-07-02 – 2019-07-10 (×15): 100 mg via ORAL
  Filled 2019-07-02 (×15): qty 1

## 2019-07-02 MED ORDER — TRANEXAMIC ACID 1000 MG/10ML IV SOLN
INTRAVENOUS | Status: DC | PRN
  Administered 2019-07-02: 2000 mg via TOPICAL

## 2019-07-02 MED ORDER — DEXAMETHASONE SODIUM PHOSPHATE 10 MG/ML IJ SOLN
10.0000 mg | Freq: Once | INTRAMUSCULAR | Status: AC
  Administered 2019-07-03: 10 mg via INTRAVENOUS
  Filled 2019-07-02: qty 1

## 2019-07-02 MED ORDER — LIDOCAINE 2% (20 MG/ML) 5 ML SYRINGE
INTRAMUSCULAR | Status: AC
  Filled 2019-07-02: qty 5

## 2019-07-02 MED ORDER — OXYCODONE-ACETAMINOPHEN 5-325 MG PO TABS: 1.0000 | ORAL_TABLET | ORAL | 0 refills | Status: DC | PRN

## 2019-07-02 MED ORDER — OXYCODONE HCL 5 MG/5ML PO SOLN: 5.0000 mg | Freq: Once | ORAL | Status: DC | PRN

## 2019-07-02 MED ORDER — SODIUM CHLORIDE (PF) 0.9 % IJ SOLN
INTRAMUSCULAR | Status: AC
  Filled 2019-07-02: qty 20

## 2019-07-02 MED ORDER — FENTANYL CITRATE (PF) 100 MCG/2ML IJ SOLN: 25.0000 ug | INTRAMUSCULAR | Status: DC | PRN

## 2019-07-02 MED ORDER — ONDANSETRON HCL 4 MG/2ML IJ SOLN
INTRAMUSCULAR | Status: DC | PRN
  Administered 2019-07-02: 4 mg via INTRAVENOUS

## 2019-07-02 MED ORDER — ONDANSETRON HCL 4 MG/2ML IJ SOLN
INTRAMUSCULAR | Status: AC
  Filled 2019-07-02: qty 2

## 2019-07-02 MED ORDER — POVIDONE-IODINE 10 % EX SWAB
2.0000 "application " | Freq: Once | CUTANEOUS | Status: AC
  Administered 2019-07-02: 2 via TOPICAL

## 2019-07-02 SURGICAL SUPPLY — 61 items
ATTUNE MED DOME PAT 41 KNEE (Knees) ×1 IMPLANT
ATTUNE MED DOME PAT 41MM KNEE (Knees) ×1 IMPLANT
ATTUNE PS FEM LT SZ 8 CEM KNEE (Femur) ×2 IMPLANT
ATTUNE PSRP INSR SZ8 6 KNEE (Insert) ×1 IMPLANT
ATTUNE PSRP INSR SZ8 6MM KNEE (Insert) ×1 IMPLANT
BAG DECANTER FOR FLEXI CONT (MISCELLANEOUS) ×3 IMPLANT
BAG SPEC THK2 15X12 ZIP CLS (MISCELLANEOUS) ×1
BAG ZIPLOCK 12X15 (MISCELLANEOUS) ×3 IMPLANT
BASE TIBIAL ATTUNE KNEE SZ9 (Knees) IMPLANT
BLADE SAG 18X100X1.27 (BLADE) ×3 IMPLANT
BLADE SAW SGTL 11.0X1.19X90.0M (BLADE) ×3 IMPLANT
BLADE SURG SZ10 CARB STEEL (BLADE) ×6 IMPLANT
BNDG CMPR MED 10X6 ELC LF (GAUZE/BANDAGES/DRESSINGS) ×1
BNDG CMPR MED 15X6 ELC VLCR LF (GAUZE/BANDAGES/DRESSINGS) ×1
BNDG ELASTIC 6X10 VLCR STRL LF (GAUZE/BANDAGES/DRESSINGS) ×3 IMPLANT
BNDG ELASTIC 6X15 VLCR STRL LF (GAUZE/BANDAGES/DRESSINGS) ×2 IMPLANT
BOWL SMART MIX CTS (DISPOSABLE) ×3 IMPLANT
BSPLAT TIB 9 CMNT ROT PLAT STR (Knees) ×1 IMPLANT
CEMENT HV SMART SET (Cement) ×6 IMPLANT
COVER SURGICAL LIGHT HANDLE (MISCELLANEOUS) ×3 IMPLANT
COVER WAND RF STERILE (DRAPES) ×2 IMPLANT
CUFF TOURN SGL QUICK 34 (TOURNIQUET CUFF) ×3
CUFF TRNQT CYL 34X4.125X (TOURNIQUET CUFF) ×1 IMPLANT
DECANTER SPIKE VIAL GLASS SM (MISCELLANEOUS) ×9 IMPLANT
DRAPE U-SHAPE 47X51 STRL (DRAPES) ×3 IMPLANT
DRSG AQUACEL AG ADV 3.5X10 (GAUZE/BANDAGES/DRESSINGS) ×3 IMPLANT
DRSG AQUACEL AG ADV 3.5X14 (GAUZE/BANDAGES/DRESSINGS) ×2 IMPLANT
DURAPREP 26ML APPLICATOR (WOUND CARE) ×3 IMPLANT
ELECT REM PT RETURN 15FT ADLT (MISCELLANEOUS) ×3 IMPLANT
GLOVE BIO SURGEON STRL SZ7.5 (GLOVE) ×3 IMPLANT
GLOVE BIO SURGEON STRL SZ8.5 (GLOVE) ×3 IMPLANT
GLOVE BIOGEL PI IND STRL 8 (GLOVE) ×1 IMPLANT
GLOVE BIOGEL PI IND STRL 9 (GLOVE) ×1 IMPLANT
GLOVE BIOGEL PI INDICATOR 8 (GLOVE) ×2
GLOVE BIOGEL PI INDICATOR 9 (GLOVE) ×2
GOWN STRL REUS W/TWL XL LVL3 (GOWN DISPOSABLE) ×6 IMPLANT
HANDPIECE INTERPULSE COAX TIP (DISPOSABLE) ×3
HOOD PEEL AWAY FLYTE STAYCOOL (MISCELLANEOUS) ×9 IMPLANT
IMMOBILIZER KNEE 16 UNIV (MISCELLANEOUS) ×2 IMPLANT
KIT TURNOVER KIT A (KITS) IMPLANT
NDL HYPO 21X1.5 SAFETY (NEEDLE) ×2 IMPLANT
NEEDLE HYPO 21X1.5 SAFETY (NEEDLE) ×6 IMPLANT
NS IRRIG 1000ML POUR BTL (IV SOLUTION) ×3 IMPLANT
PACK ICE MAXI GEL EZY WRAP (MISCELLANEOUS) ×3 IMPLANT
PACK TOTAL KNEE CUSTOM (KITS) ×3 IMPLANT
PIN DRILL FIX HALF THREAD (BIT) ×2 IMPLANT
PIN STEINMAN FIXATION KNEE (PIN) ×2 IMPLANT
PROTECTOR NERVE ULNAR (MISCELLANEOUS) ×3 IMPLANT
SET HNDPC FAN SPRY TIP SCT (DISPOSABLE) ×1 IMPLANT
SPONGE LAP 18X18 RF (DISPOSABLE) ×2 IMPLANT
SUT VIC AB 1 CTX 36 (SUTURE) ×3
SUT VIC AB 1 CTX36XBRD ANBCTR (SUTURE) ×1 IMPLANT
SUT VIC AB 3-0 CT1 27 (SUTURE) ×9
SUT VIC AB 3-0 CT1 TAPERPNT 27 (SUTURE) ×3 IMPLANT
SYR CONTROL 10ML LL (SYRINGE) ×6 IMPLANT
TIBIAL BASE ATTUNE KNEE SZ9 (Knees) ×2 IMPLANT
TIBIAL BASE ROT PLAT SZ 9 KNEE (Miscellaneous) ×2 IMPLANT
TRAY FOLEY MTR SLVR 16FR STAT (SET/KITS/TRAYS/PACK) ×3 IMPLANT
WATER STERILE IRR 1000ML POUR (IV SOLUTION) ×6 IMPLANT
WRAP KNEE MAXI GEL POST OP (GAUZE/BANDAGES/DRESSINGS) ×2 IMPLANT
YANKAUER SUCT BULB TIP 10FT TU (MISCELLANEOUS) ×3 IMPLANT

## 2019-07-02 NOTE — Discharge Instructions (Signed)

## 2019-07-02 NOTE — Interval H&P Note (Signed)
History and Physical Interval Note:  07/02/2019 7:03 AM  Gilbert Morse  has presented today for surgery, with the diagnosis of Left Knee Osteoarthritis.  The various methods of treatment have been discussed with the patient and family. After consideration of risks, benefits and other options for treatment, the patient has consented to  Procedure(s): Left Knee Arthroplasty (Left) as a surgical intervention.  The patient's history has been reviewed, patient examined, no change in status, stable for surgery.  I have reviewed the patient's chart and labs.  Questions were answered to the patient's satisfaction.     Kerin Salen

## 2019-07-02 NOTE — Anesthesia Postprocedure Evaluation (Signed)
Anesthesia Post Note  Patient: Gilbert Morse  Procedure(s) Performed: Left Knee Arthroplasty (Left Knee)     Patient location during evaluation: PACU Anesthesia Type: Spinal Level of consciousness: oriented and awake and alert Pain management: pain level controlled Vital Signs Assessment: post-procedure vital signs reviewed and stable Respiratory status: spontaneous breathing, respiratory function stable and nonlabored ventilation Cardiovascular status: blood pressure returned to baseline and stable Postop Assessment: no headache, no backache, no apparent nausea or vomiting and spinal receding Anesthetic complications: no    Last Vitals:  Vitals:   07/02/19 1000 07/02/19 1018  BP: 124/72 136/69  Pulse: 65 65  Resp: 15 16  Temp: 36.5 C 36.4 C  SpO2: 100% 98%    Last Pain:  Vitals:   07/02/19 1113  TempSrc:   PainSc: 4                  Lidia Collum

## 2019-07-02 NOTE — Op Note (Signed)
PATIENT ID:      Gilbert Morse  MRN:     [id number] DOB/AGE:    1931/06/06 / 85 y.o.       OPERATIVE REPORT   DATE OF PROCEDURE:  07/02/2019      PREOPERATIVE DIAGNOSIS:   Left Knee Osteoarthritis      Estimated body mass index is 26.79 kg/m as calculated from the following:   Height as of 06/27/19: 6' (1.829 m).   Weight as of 06/27/19: 89.6 kg.                                                       POSTOPERATIVE DIAGNOSIS:   Same                                                                PROCEDURE:  Procedure(s): Left Knee Arthroplasty Using DepuyAttune RP implants #8L Femur, #9Tibia, 6 mm Attune RP bearing, 41 Patella    SURGEON: Nestor Lewandowsky  ASSISTANT:   Tomi Likens. [employer]   (Present and scrubbed throughout the case, critical for assistance with exposure, retraction, instrumentation, and closure.)        ANESTHESIA: Spinal, 20cc Exparel, 50cc 0.25% Marcaine EBL: 300 cc FLUID REPLACEMENT: 1600 cc crystaloid TOURNIQUET: DRAINS: None TRANEXAMIC ACID: 1gm IV, 2gm topical COMPLICATIONS:  None         INDICATIONS FOR PROCEDURE: The patient has  Left Knee Osteoarthritis, Var deformities, XR shows bone on bone arthritis, lateral subluxation of tibia. Patient has failed all conservative measures including anti-inflammatory medicines, narcotics, attempts at exercise and weight loss, cortisone injections and viscosupplementation.  Risks and benefits of surgery have been discussed, questions answered.   DESCRIPTION OF PROCEDURE: The patient identified by armband, received  IV antibiotics, in the holding area at [facility]. Patient taken to the operating room, appropriate anesthetic monitors were attached, and Spinal anesthesia was  induced. IV Tranexamic acid was given.Tourniquet applied high to the operative thigh. Lateral post and foot positioner applied to the table, the lower extremity was then prepped and draped in usual sterile fashion from the toes to the tourniquet.  Time-out procedure was performed. The skin and subcutaneous tissue along the incision was injected with 20 cc of a mixture of Exparel and Marcaine solution, using a 20-gauge by 1-1/2 inch needle. We began the operation, with the knee flexed 130 degrees, by making the anterior midline incision starting at handbreadth above the patella going over the patella 1 cm medial to and 4 cm distal to the tibial tubercle. Small bleeders in the skin and the subcutaneous tissue identified and cauterized. Transverse retinaculum was incised and reflected medially and a medial parapatellar arthrotomy was accomplished. the patella was everted and theprepatellar fat pad resected. The superficial medial collateral ligament was then elevated from anterior to posterior along the proximal flare of the tibia and anterior half of the menisci resected. The knee was hyperflexed exposing bone on bone arthritis. Peripheral and notch osteophytes as well as the cruciate ligaments were then resected. We continued to work our way around posteriorly along the proximal tibia, and externally  rotated the tibia subluxing it out from underneath the femur. A McHale PCL retractor was placed through the notch and a lateral Hohmann retractor placed, and we then entered the proximal tibia in line with the Depuy starter drill in line with the axis of the tibia followed by an intramedullary guide rod and 0-degree posterior slope cutting guide. The tibial cutting guide, 4 degree posterior sloped, was pinned into place allowing resection of -1 mm of bone medially and 11 mm of bone laterally. Satisfied with the tibial resection, we then entered the distal femur 2 mm anterior to the PCL origin with the intramedullary guide rod and applied the distal femoral cutting guide set at 9 mm, with 5 degrees of valgus. This was pinned along the epicondylar axis. At this point, the distal femoral cut was accomplished without difficulty. We then sized for a #8R femoral  component and pinned the guide in 3 degrees of external rotation. The chamfer cutting guide was pinned into place. The anterior, posterior, and chamfer cuts were accomplished without difficulty followed by the Attune RP box cutting guide and the box cut. We also removed posterior osteophytes from the posterior femoral condyles. The posterior capsule was injected with Exparel solution. The knee was brought into full extension. We checked our extension gap and fit a 6 mm bearing. Distracting in extension with a lamina spreader,  bleeders in the posterior capsule, Posterior medial and posterior lateral gutter were cauterized.  The transexamic acid-soaked sponge was then placed in the gap of the knee in extension. The knee was flexed 30. The posterior patella cut was accomplished with the 9.5 mm Attune cutting guide, sized for a 86mm dome, and the fixation pegs drilled.The knee was then once again hyperflexed exposing the proximal tibia. We sized for a # 9 tibial base plate, applied the smokestack and the conical reamer followed by the the Delta fin keel punch. We then hammered into place the Attune RP trial femoral component, drilled the lugs, inserted a  6 mm trial bearing, trial patellar button, and took the knee through range of motion from 0-130 degrees. Medial and lateral ligamentous stability was checked. No thumb pressure was required for patellar Tracking. The tourniquet was not used. All trial components were removed, mating surfaces irrigated with pulse lavage, and dried with suction and sponges. 10 cc of the Exparel solution was applied to the cancellus bone of the patella distal femur and proximal tibia.  After waiting 30 seconds, the bony surfaces were again, dried with sponges. A double batch of DePuy HV cement was mixed and applied to all bony metallic mating surfaces except for the posterior condyles of the femur itself. In order, we hammered into place the tibial tray and removed excess cement, the  femoral component and removed excess cement. The final Attune RP bearing was inserted, and the knee brought to full extension with compression. The patellar button was clamped into place, and excess cement removed. The knee was held at 30 flexion with compression, while the cement cured. The wound was irrigated out with normal saline solution pulse lavage. The rest of the Exparel was injected into the parapatellar arthrotomy, subcutaneous tissues, and periosteal tissues. The parapatellar arthrotomy was closed with running #1 Vicryl suture. The subcutaneous tissue with 0 and 2-0 undyed Vicryl suture, and the skin with running 3-0 SQ vicryl. An Aquacil and Ace wrap were applied. The patient was taken to recovery room without difficulty.   Kerin Salen 07/02/2019, 7:04 AM

## 2019-07-02 NOTE — Care Plan (Signed)
Ortho Bundle Case Management Note  Patient Details  Name: Gilbert Morse MRN: [id number] Date of Birth: 27-May-1931  Spoke with patient and wife prior to surgery. Planning to discharge to home with HHPT. Referral to [facility]. Has equipment at home. He will transition to OPPT after follow up with MD in the office. Patient and MD in agreement with plan. Choice offered.                   DME Arranged:    DME Agency:     HH Arranged:  PT HH Agency:  [facility] ([facility]  Additional Comments: Please contact me with any questions of if this plan should need to change.  Ladell Heads,  [place] [facility]  [phone] 07/02/2019, 2:30 PM

## 2019-07-02 NOTE — Anesthesia Procedure Notes (Signed)
Anesthesia Regional Block: Adductor canal block   Pre-Anesthetic Checklist: ,, timeout performed, Correct Patient, Correct Site, Correct Laterality, Correct Procedure, Correct Position, site marked, Risks and benefits discussed,  Surgical consent,  Pre-op evaluation,  At surgeon's request and post-op pain management  Laterality: Left  Prep: chloraprep       Needles:  Injection technique: Single-shot  Needle Type: Echogenic Stimulator Needle     Needle Length: 10cm  Needle Gauge: 21     Additional Needles:   Procedures:,,,, ultrasound used (permanent image in chart),,,,  Narrative:  Start time: 07/02/2019 7:00 AM End time: 07/02/2019 7:04 AM Injection made incrementally with aspirations every 5 mL.  Performed by: Personally  Anesthesiologist: Lidia Collum, MD  Additional Notes: Monitors applied. Injection made in 5cc increments. No resistance to injection. Good needle visualization. Patient tolerated procedure well.

## 2019-07-02 NOTE — Plan of Care (Signed)
Plan of care 

## 2019-07-02 NOTE — Transfer of Care (Signed)
Immediate Anesthesia Transfer of Care Note  Patient: Gilbert Morse  Procedure(s) Performed: Left Knee Arthroplasty (Left Knee)  Patient Location: PACU  Anesthesia Type:Spinal  Level of Consciousness: awake, alert , oriented and patient cooperative  Airway & Oxygen Therapy: Patient Spontanous Breathing and Patient connected to face mask oxygen  Post-op Assessment: Report given to RN and Post -op Vital signs reviewed and stable  Post vital signs: Reviewed and stable  Last Vitals:  Vitals Value Taken Time  BP 133/73 07/02/19 0919  Temp    Pulse 67 07/02/19 0920  Resp 17 07/02/19 0920  SpO2 100 % 07/02/19 0920  Vitals shown include unvalidated device data.  Last Pain:  Vitals:   07/02/19 0641  TempSrc: Oral  PainSc:          Complications: No apparent anesthesia complications

## 2019-07-02 NOTE — Evaluation (Signed)
Physical Therapy Evaluation Patient Details Name: Gilbert Morse MRN: [id number] DOB: June 16, 1931 Today's Date: 07/02/2019   History of Present Illness  Patient is 85 y.o. male s/p Lt TKA on 07/02/19 with PMH significant HTN, history of falls, and OA.  Clinical Impression  [name] is a 85 y.o. Male POD 0 s/p Lt TKA. Patient reports modified independence with RW for mobility at baseline. Patient is now limited by functional impairments (see PT problem list below) and requires min assist for transfers and gait with RW. Patient was able to ambulate ~12 feet with RW and was limited by nausea. Patient instructed on exercises to facilitate ROM and circulation. Patient will benefit from continued skilled PT interventions to address impairments and progress towards PLOF. Acute PT will follow to progress mobility and stair training in preparation for safe discharge home.    Follow Up Recommendations Follow surgeon's recommendation for DC plan and follow-up therapies    Equipment Recommendations  None recommended by PT    Recommendations for Other Services       Precautions / Restrictions Precautions Precautions: Fall Restrictions Weight Bearing Restrictions: No      Mobility  Bed Mobility Overal bed mobility: Needs Assistance Bed Mobility: Supine to Sit     Supine to sit: Min assist;HOB elevated     General bed mobility comments: cues for use of bed rails and assist to raise trunk and bring LE's off EOB  Transfers Overall transfer level: Needs assistance Equipment used: Rolling walker (2 wheeled) Transfers: Sit to/from Stand Sit to Stand: Min assist;From elevated surface         General transfer comment: cues for safe hand placement and technique with RW, assist to initiate power up and rise  Ambulation/Gait Ambulation/Gait assistance: Min assist Gait Distance (Feet): 12 Feet Assistive device: Rolling walker (2 wheeled) Gait Pattern/deviations: Step-to  pattern;Decreased stride length;Decreased step length - right;Decreased step length - left;Decreased stance time - left;Trunk flexed Gait velocity: decreased   General Gait Details: pt required cues for safe hand placement on walker and to steady throughout, pt limited by nausea  Stairs            Wheelchair Mobility    Modified Rankin (Stroke Patients Only)       Balance Overall balance assessment: Needs assistance Sitting-balance support: Feet supported;No upper extremity supported;Single extremity supported Sitting balance-Leahy Scale: Fair     Standing balance support: During functional activity;Bilateral upper extremity supported Standing balance-Leahy Scale: Poor            Pertinent Vitals/Pain Pain Assessment: 0-10 Pain Score: 3  Pain Location: Lt knee Pain Descriptors / Indicators: Aching;Sore Pain Intervention(s): Limited activity within patient's tolerance;Monitored during session;Repositioned    Home Living Family/patient expects to be discharged to:: Private residence Living Arrangements: Spouse/significant other Available Help at Discharge: Family;Available 24 hours/day(pt's son is staying until Thursday and then goes back to [facility], pt's wife is home always) Type of Home: House Home Access: Stairs to enter Entrance Stairs-Rails: Right;Left;Can reach both Entrance Stairs-Number of Steps: 2 at front and 1 at back Home Layout: One level Home Equipment: [place] - 2 wheels;Cane - single point;Shower seat;Bedside commode;Grab bars - tub/shower      Prior Function Level of Independence: Independent with assistive device(s)         Comments: pt uses RW for mobility at baseline     Hand Dominance   Dominant Hand: Right    Extremity/Trunk Assessment   Upper Extremity Assessment Upper Extremity Assessment:  Generalized weakness    Lower Extremity Assessment Lower Extremity Assessment: Generalized weakness;LLE deficits/detail LLE Deficits /  Details: knee immobilizer donned due to quad weakness LLE: Unable to fully assess due to immobilization LLE Sensation: WNL LLE Coordination: WNL    Cervical / Trunk Assessment Cervical / Trunk Assessment: Kyphotic  Communication   Communication: HOH  Cognition Arousal/Alertness: Awake/alert Behavior During Therapy: WFL for tasks assessed/performed Overall Cognitive Status: Within Functional Limits for tasks assessed         General Comments      Exercises Total Joint Exercises Ankle Circles/Pumps: AROM;15 reps;Seated;Both Quad Sets: AROM;10 reps;Supine;Left   Assessment/Plan    PT Assessment Patient needs continued PT services  PT Problem List Decreased strength;Decreased balance;Decreased mobility;Decreased range of motion;Decreased activity tolerance;Decreased knowledge of use of DME       PT Treatment Interventions DME instruction;Functional mobility training;Balance training;Patient/family education;Modalities;Gait training;Therapeutic exercise;Stair training;Therapeutic activities    PT Goals (Current goals can be found in the Care Plan section)  Acute Rehab PT Goals Patient Stated Goal: to go back home PT Goal Formulation: With patient Time For Goal Achievement: 07/09/19 Potential to Achieve Goals: Good    Frequency 7X/week    AM-PAC PT "6 Clicks" Mobility  Outcome Measure Help needed turning from your back to your side while in a flat bed without using bedrails?: A Little Help needed moving from lying on your back to sitting on the side of a flat bed without using bedrails?: A Little Help needed moving to and from a bed to a chair (including a wheelchair)?: A Little Help needed standing up from a chair using your arms (e.g., wheelchair or bedside chair)?: A Little Help needed to walk in hospital room?: A Little Help needed climbing 3-5 steps with a railing? : A Lot 6 Click Score: 17    End of Session Equipment Utilized During Treatment: Gait belt Activity  Tolerance: Patient tolerated treatment well Patient left: in chair;with call bell/phone within reach;with family/visitor present;with chair alarm set Nurse Communication: Mobility status PT Visit Diagnosis: Muscle weakness (generalized) (M62.81);Difficulty in walking, not elsewhere classified (R26.2)    Time: 4650-3546 PT Time Calculation (min) (ACUTE ONLY): 31 min   Charges:   PT Evaluation $PT Eval Low Complexity: 1 Low PT Treatments $Therapeutic Exercise: 8-22 mins        Valentino Saxon, PT, DPT Physical Therapist with [facility]  07/02/2019 4:03 PM

## 2019-07-02 NOTE — Anesthesia Procedure Notes (Signed)
Spinal  Patient location during procedure: OR Staffing Anesthesiologist: Lidia Collum, MD Performed: anesthesiologist  Preanesthetic Checklist Completed: patient identified, surgical consent, pre-op evaluation, timeout performed, IV checked, risks and benefits discussed and monitors and equipment checked Spinal Block Patient position: sitting Prep: site prepped and draped and DuraPrep Patient monitoring: continuous pulse ox, blood pressure and heart rate Approach: midline Location: L2-3 Injection technique: single-shot Needle Needle type: Quincke  Needle gauge: 22 G Needle length: 9 cm Additional Notes Functioning IV was confirmed and monitors were applied. Sterile prep and drape, including hand hygiene and sterile gloves were used. The patient was positioned and the spine was prepped. The skin was anesthetized with lidocaine.  Free flow of clear CSF was obtained prior to injecting local anesthetic into the CSF. The needle was carefully withdrawn. The patient tolerated the procedure well.

## 2019-07-03 ENCOUNTER — Encounter (HOSPITAL_COMMUNITY): Payer: Self-pay | Admitting: Orthopedic Surgery

## 2019-07-03 LAB — CBC
HCT: 34.6 % — ABNORMAL LOW (ref 39.0–52.0)
Hemoglobin: 11.1 g/dL — ABNORMAL LOW (ref 13.0–17.0)
MCH: 28.9 pg (ref 26.0–34.0)
MCHC: 32.1 g/dL (ref 30.0–36.0)
MCV: 90.1 fL (ref 80.0–100.0)
Platelets: 196 10*3/uL (ref 150–400)
RBC: 3.84 MIL/uL — ABNORMAL LOW (ref 4.22–5.81)
RDW: 15 % (ref 11.5–15.5)
WBC: 12.3 10*3/uL — ABNORMAL HIGH (ref 4.0–10.5)
nRBC: 0 % (ref 0.0–0.2)

## 2019-07-03 LAB — BASIC METABOLIC PANEL
Anion gap: 6 (ref 5–15)
BUN: 25 mg/dL — ABNORMAL HIGH (ref 8–23)
CO2: 27 mmol/L (ref 22–32)
Calcium: 8.2 mg/dL — ABNORMAL LOW (ref 8.9–10.3)
Chloride: 97 mmol/L — ABNORMAL LOW (ref 98–111)
Creatinine, Ser: 1.16 mg/dL (ref 0.61–1.24)
GFR calc Af Amer: >60 mL/min (ref >60)
GFR calc non Af Amer: 57 mL/min — ABNORMAL LOW (ref >60)
Glucose, Bld: 148 mg/dL — ABNORMAL HIGH (ref 70–99)
Potassium: 4.2 mmol/L (ref 3.5–5.1)
Sodium: 130 mmol/L — ABNORMAL LOW (ref 135–145)

## 2019-07-03 MED ORDER — GABAPENTIN 100 MG PO CAPS
100.0000 mg | ORAL_CAPSULE | Freq: Three times a day (TID) | ORAL | Status: DC
  Administered 2019-07-03 – 2019-07-10 (×22): 100 mg via ORAL
  Filled 2019-07-03 (×22): qty 1

## 2019-07-03 NOTE — Progress Notes (Signed)
Pt MEWS score is yellow. Pt with low BP this am. Encouraged PO intake and resumed IV fluids. Pt alert ans oriented without complaints. Will continue to monitor.

## 2019-07-03 NOTE — Progress Notes (Signed)
Physical Therapy Treatment Patient Details Name: Gilbert Morse MRN: [id number] DOB: Jul 08, 1931 Today's Date: 07/03/2019    History of Present Illness Patient is 85 y.o. male s/p Lt TKA on 07/02/19 with PMH significant HTN, history of falls, and OA.    PT Comments    POD #1 pm session. Pt demonstrated good motivation today. Pt son was present for today's session. Orthostatic Vitals Supine: BP:110/62 HR:68 EOB: BP:94/75 HR:75 Standing: BP:75/44 HR:87 General bed mobility comments: Pt was able to move his LE's and shoulders in the bed to EOB. Pt still presenting with a R postereior lean. Pt required assistance to scoot to EOB General transfer comment: Pt required max assist to perform a stand piviot transfer to the chair.   Follow Up Recommendations  Follow surgeon's recommendation for DC plan and follow-up therapies     Equipment Recommendations  None recommended by PT    Recommendations for Other Services       Precautions / Restrictions Precautions Precautions: Fall Restrictions Weight Bearing Restrictions: No    Mobility  Bed Mobility Overal bed mobility: Needs Assistance Bed Mobility: Supine to Sit     Supine to sit: Mod assist Sit to supine: +2 for physical assistance;+2 for safety/equipment;Total assist   General bed mobility comments: Pt was able to move his LE's and shoulders in the bed to EOB. Pt still presenting with a R postereior lean. Pt required assistance to scoot to EOB  Transfers Overall transfer level: Needs assistance Equipment used: Rolling walker (2 wheeled) Transfers: Sit to/from [employer] Sit to Stand: From elevated surface;Max assist;+2 physical assistance Stand pivot transfers: Max assist;+2 physical assistance       General transfer comment: Pt required max assist to perform a stand piviot transfer to the chair.  Ambulation/Gait             General Gait Details: Not attempted due to low BP   Stairs              Wheelchair Mobility    Modified Rankin (Stroke Patients Only)       Balance                                            Cognition Arousal/Alertness: Awake/alert Behavior During Therapy: WFL for tasks assessed/performed Overall Cognitive Status: Within Functional Limits for tasks assessed                                 General Comments: HOH and required repeat functional VC's due to c/o nausea      Exercises      General Comments        Pertinent Vitals/Pain Pain Assessment: Faces Faces Pain Scale: Hurts a little bit Pain Location: low back Pain Descriptors / Indicators: Discomfort;Aching Pain Intervention(s): Monitored during session;Repositioned;Ice applied(ice applied to knee)    Home Living                      Prior Function            PT Goals (current goals can now be found in the care plan section) Progress towards PT goals: Progressing toward goals    Frequency    7X/week      PT Plan      Co-evaluation  AM-PAC PT "6 Clicks" Mobility   Outcome Measure  Help needed turning from your back to your side while in a flat bed without using bedrails?: A Lot Help needed moving from lying on your back to sitting on the side of a flat bed without using bedrails?: A Lot Help needed moving to and from a bed to a chair (including a wheelchair)?: Total Help needed standing up from a chair using your arms (e.g., wheelchair or bedside chair)?: Total Help needed to walk in hospital room?: Total Help needed climbing 3-5 steps with a railing? : Total 6 Click Score: 8    End of Session Equipment Utilized During Treatment: Gait belt Activity Tolerance: (Hypotension/nausea) Patient left: in bed;with bed alarm set;with family/visitor present Nurse Communication: Mobility status(BP and nausea) PT Visit Diagnosis: Muscle weakness (generalized) (M62.81);Difficulty in walking, not elsewhere  classified (R26.2)     Time: 1350-1415 PT Time Calculation (min) (ACUTE ONLY): 25 min  Charges:  $Therapeutic Activity: 23-37 mins                     Excell Seltzer, [facility]

## 2019-07-03 NOTE — Progress Notes (Signed)
Pt attempted to work with PT again this afternoon. Stood and got to chair, but still dizzy. Notified PA, NS bolus given. Pt remained alert during session. Without further complaints when settled into chair. Pushed PO fluids.

## 2019-07-03 NOTE — Progress Notes (Signed)
PATIENT ID: Gilbert Morse  MRN: [id number]  DOB/AGE:  02/13/1931 / 85 y.o.  1 Day Post-Op Procedure(s) (LRB): Left Knee Arthroplasty (Left)    PROGRESS NOTE Subjective: Patient is alert, oriented, no Nausea, no Vomiting, yes passing gas. Taking PO well. Denies SOB, Chest or Calf Pain. Using Incentive Spirometer, PAS in place. Ambulate 12', Patient reports pain as 3/10 .    Objective: Vital signs in last 24 hours: Vitals:   07/02/19 2043 07/02/19 2050 07/03/19 0125 07/03/19 0555  BP: 105/82  (Abnormal) 127/55 113/61  Pulse: 87 78 80 72  Resp: 20 18 18 14   Temp: 97.8 F (36.6 C)  97.7 F (36.5 C) 98.4 F (36.9 C)  TempSrc: Oral  Oral Oral  SpO2: 100%  99% 99%  Weight:      Height:          Intake/Output from previous day: I/O last 3 completed shifts: In: 3824.6 [P.O.:660; I.V.:3114.6; IV Piggyback:50] Out: 2475 [Urine:2325; Blood:150]   Intake/Output this shift: No intake/output data recorded.   LABORATORY DATA: Recent Labs    07/03/19 0301  WBC 12.3*  HGB 11.1*  HCT 34.6*  PLT 196  NA 130*  K 4.2  CL 97*  CO2 27  BUN 25*  CREATININE 1.16  GLUCOSE 148*  CALCIUM 8.2*    Examination: Neurologically intact ABD soft Neurovascular intact Sensation intact distally Intact pulses distally Dorsiflexion/Plantar flexion intact Incision: dressing C/D/I No cellulitis present Compartment soft}  Assessment:   1 Day Post-Op Procedure(s) (LRB): Left Knee Arthroplasty (Left) ADDITIONAL DIAGNOSIS: Expected Acute Blood Loss Anemia, Hypertension , BPH  Patient's anticipated LOS is less than 2 midnights, meeting these requirements: - Lives within 1 hour of care - Has a competent adult at home to recover with post-op recover - NO history of  - Chronic pain requiring opiods  - Diabetes  - Coronary Artery Disease  - Heart failure  - Heart attack  - Stroke  - DVT/VTE  - Cardiac arrhythmia  - Respiratory Failure/COPD  - Renal failure  - Anemia  - Advanced Liver  disease       Plan: PT/OT WBAT, AROM and PROM  DVT Prophylaxis:  SCDx72hrs, ASA 81 mg BID x 2 weeks DISCHARGE PLAN: Home, if passes PT today DISCHARGE NEEDS: HHPT, Walker and 3-in-1 comode seat     Kerin Salen 07/03/2019, 7:41 AM

## 2019-07-03 NOTE — Progress Notes (Signed)
Pt  Worked with PT this am and BP dropped per note by PT assistant. Pt unable to be out of bed because of dizziness. PA notified. PO fluids pushed and NS bolus given. Pt without further complaints and was alert during PT session.

## 2019-07-03 NOTE — Progress Notes (Signed)
Physical Therapy Treatment Patient Details Name: Gilbert Morse MRN: [id number] DOB: [birth year] Today's Date: 07/03/2019    History of Present Illness Patient is 85 y.o. male s/p Lt TKA on 07/02/19 with PMH significant HTN, history of falls, and OA.    PT Comments    POD # 1 am session Assisted to EOB.  General bed mobility comments: pt unable to perform supine to sit as well as yesterday.  Required MAX ASIIST and unable to achieve self upright posture.  Severe R posterior lean.  Required Mod Assist for support.  BP 71/49 and HR 79.  Returned to supine Total Assist + 2 and reported to RN. Will see again this afternoon.    Follow Up Recommendations  Follow surgeon's recommendation for DC plan and follow-up therapies     Equipment Recommendations  None recommended by PT    Recommendations for Other Services       Precautions / Restrictions Precautions Precautions: Fall Restrictions Weight Bearing Restrictions: No    Mobility  Bed Mobility Overal bed mobility: Needs Assistance Bed Mobility: Supine to Sit;Sit to Supine     Supine to sit: Max assist Sit to supine: +2 for physical assistance;+2 for safety/equipment;Total assist   General bed mobility comments: pt unable to perform supine to sit as well as yesterday.  Required MAX ASIIST and unable to achieve self upright posture.  Severe R posterior lean.  Required Mod Assist for support.  BP 71/49 and HR 79.  Returned to supine Total Assist + 2 and reported to RN.  Transfers                 General transfer comment: unable to attempt OOB activity due to Hypotension.  Ambulation/Gait                 Stairs             Wheelchair Mobility    Modified Rankin (Stroke Patients Only)       Balance                                            Cognition Arousal/Alertness: Awake/alert                                     General Comments: HOH and required repeat  functional VC's due to c/o nausea      Exercises      General Comments        Pertinent Vitals/Pain Pain Assessment: Faces Faces Pain Scale: Hurts little more Pain Location: Lt knee Pain Descriptors / Indicators: Aching;Sore;Operative site guarding Pain Intervention(s): Monitored during session;Premedicated before session;Repositioned;Ice applied    Home Living                      Prior Function            PT Goals (current goals can now be found in the care plan section) Progress towards PT goals: Progressing toward goals    Frequency    7X/week      PT Plan      Co-evaluation              AM-PAC PT "6 Clicks" Mobility   Outcome Measure  Help needed turning from your back to your side while  in a flat bed without using bedrails?: A Lot Help needed moving from lying on your back to sitting on the side of a flat bed without using bedrails?: A Lot Help needed moving to and from a bed to a chair (including a wheelchair)?: Total Help needed standing up from a chair using your arms (e.g., wheelchair or bedside chair)?: Total Help needed to walk in hospital room?: Total Help needed climbing 3-5 steps with a railing? : Total 6 Click Score: 8    End of Session Equipment Utilized During Treatment: Gait belt Activity Tolerance: (Hypotension/nausea) Patient left: in bed;with bed alarm set;with family/visitor present Nurse Communication: Mobility status(BP and nausea) PT Visit Diagnosis: Muscle weakness (generalized) (M62.81);Difficulty in walking, not elsewhere classified (R26.2)     Time: 6967-8938 PT Time Calculation (min) (ACUTE ONLY): 21 min  Charges:  $Therapeutic Activity: 8-22 mins                     Rica Koyanagi  PTA Acute  Rehabilitation Services Pager      [phone] Office      [phone]

## 2019-07-03 NOTE — Plan of Care (Signed)
Plan of care reviewed and discussed with the patient. 

## 2019-07-04 DIAGNOSIS — Z79899 Other long term (current) drug therapy: Secondary | ICD-10-CM | POA: Diagnosis not present

## 2019-07-04 DIAGNOSIS — D62 Acute posthemorrhagic anemia: Secondary | ICD-10-CM | POA: Diagnosis not present

## 2019-07-04 DIAGNOSIS — N400 Enlarged prostate without lower urinary tract symptoms: Secondary | ICD-10-CM | POA: Diagnosis present

## 2019-07-04 DIAGNOSIS — L8992 Pressure ulcer of unspecified site, stage 2: Secondary | ICD-10-CM | POA: Diagnosis present

## 2019-07-04 DIAGNOSIS — I10 Essential (primary) hypertension: Secondary | ICD-10-CM | POA: Diagnosis present

## 2019-07-04 DIAGNOSIS — Z96652 Presence of left artificial knee joint: Secondary | ICD-10-CM

## 2019-07-04 DIAGNOSIS — M1712 Unilateral primary osteoarthritis, left knee: Secondary | ICD-10-CM | POA: Diagnosis present

## 2019-07-04 LAB — CBC
HCT: 31.5 % — ABNORMAL LOW (ref 39.0–52.0)
Hemoglobin: 10 g/dL — ABNORMAL LOW (ref 13.0–17.0)
MCH: 28.8 pg (ref 26.0–34.0)
MCHC: 31.7 g/dL (ref 30.0–36.0)
MCV: 90.8 fL (ref 80.0–100.0)
Platelets: 174 10*3/uL (ref 150–400)
RBC: 3.47 MIL/uL — ABNORMAL LOW (ref 4.22–5.81)
RDW: 14.9 % (ref 11.5–15.5)
WBC: 18.7 10*3/uL — ABNORMAL HIGH (ref 4.0–10.5)
nRBC: 0 % (ref 0.0–0.2)

## 2019-07-04 MED ORDER — SODIUM CHLORIDE 0.9 % IV BOLUS
500.0000 mL | Freq: Once | INTRAVENOUS | Status: AC
  Administered 2019-07-04: 500 mL via INTRAVENOUS

## 2019-07-04 NOTE — Progress Notes (Signed)
Physical Therapy Evaluation Patient Details Name: Gilbert Morse MRN: [id number] DOB: 11-19-1930 Today's Date: 07/04/2019   History of Present Illness  Patient is 85 y.o. male s/p Lt TKA on 07/02/19 with PMH significant HTN, history of falls, and OA.  Clinical Impression  POD #2 am session.  Pt demonstrated good motivation to sit up and move a little. Pt son was present for treatment. Orthostatic BP Supine: BP 105/49 HR 65 Oxygen sats 98% EOB: BP 92/54 HR 77 Oxygen sats 96% Standing: BP 88/53 HR 89 Oxygen sats 96% Walking: BP 69/43 HR 82 Oxygen sats 94% General bed mobility comments: Pt was able to move his LE's and shoulders. Pt was able to sit up on EOB with min assist. Pt required min assist to scoot to EOB General transfer comment: Pt required mod-max assist to perfom a sit to stand. Pt was able to bear more weight on his LE's than he did yesterday. Pt BP is still very low. General Gait Details: Pt was able to ambulate 5 ft with a RW and a 2+ assist with the recliner following. Pt BP low   Follow Up Recommendations Follow surgeon's recommendation for DC plan and follow-up therapies    Equipment Recommendations  None recommended by PT    Recommendations for Other Services       Precautions / Restrictions Precautions Precautions: Fall Restrictions Weight Bearing Restrictions: No      Mobility  Bed Mobility Overal bed mobility: Needs Assistance Bed Mobility: Supine to Sit     Supine to sit: Min assist     General bed mobility comments: Pt was able to move his LE's and shoulders. Pt was able to sit up on EOB with min assist. Pt required min assist to scoot to EOB  Transfers Overall transfer level: Needs assistance Equipment used: Rolling walker (2 wheeled) Transfers: Sit to/from Stand Sit to Stand: From elevated surface;+2 physical assistance;Mod assist;Max assist         General transfer comment: Pt required mod-max assist to perfom a sit to stand. Pt was able  to bear more weight on his LE's than he did yesterday. Pt BP is still very low.  Ambulation/Gait Ambulation/Gait assistance: Min assist Gait Distance (Feet): 5 Feet Assistive device: Rolling walker (2 wheeled) Gait Pattern/deviations: Step-to pattern;Decreased stride length;Decreased step length - right;Decreased step length - left;Decreased stance time - left;Trunk flexed Gait velocity: decreased   General Gait Details: Pt was able to ambulate 5 ft with a RW and a 2+ assist with the recliner following. Pt BP low  Stairs            Wheelchair Mobility    Modified Rankin (Stroke Patients Only)       Balance                                             Pertinent Vitals/Pain Pain Assessment: Faces Faces Pain Scale: Hurts a little bit Pain Location: L knee pain Pain Descriptors / Indicators: Discomfort;Aching;Sore Pain Intervention(s): Monitored during session;Repositioned;Ice applied    Home Living                        Prior Function                 Hand Dominance        Extremity/Trunk Assessment  Communication      Cognition Arousal/Alertness: Awake/alert Behavior During Therapy: WFL for tasks assessed/performed Overall Cognitive Status: Within Functional Limits for tasks assessed                                        General Comments      Exercises     Assessment/Plan    PT Assessment    PT Problem List         PT Treatment Interventions      PT Goals (Current goals can be found in the Care Plan section)       Frequency 7X/week   Barriers to discharge        Co-evaluation               AM-PAC PT "6 Clicks" Mobility  Outcome Measure Help needed turning from your back to your side while in a flat bed without using bedrails?: A Lot Help needed moving from lying on your back to sitting on the side of a flat bed without using bedrails?: A Lot Help needed  moving to and from a bed to a chair (including a wheelchair)?: A Lot Help needed standing up from a chair using your arms (e.g., wheelchair or bedside chair)?: A Lot Help needed to walk in hospital room?: Total Help needed climbing 3-5 steps with a railing? : Total 6 Click Score: 10    End of Session Equipment Utilized During Treatment: Gait belt Activity Tolerance: Patient tolerated treatment well Patient left: in chair;with chair alarm set;with family/visitor present;with call bell/phone within reach Nurse Communication: Mobility status PT Visit Diagnosis: Muscle weakness (generalized) (M62.81);Difficulty in walking, not elsewhere classified (R26.2)    Time: 2010-0712 PT Time Calculation (min) (ACUTE ONLY): 24 min   Charges:     PT Treatments $Gait Training: 8-22 mins $Therapeutic Activity: 8-22 mins        Excell Seltzer, [facility]

## 2019-07-04 NOTE — Progress Notes (Signed)
Pt worked with PT this morning and had another orthostatic hypotension event while standing. Pt got a little dizzy and said vision was blurry, otherwise no other symptoms, and pt remained alert during episode. [name] PA notified and order received for 500 cc NS bolus. Pt in chair resting comfortably talking to son.

## 2019-07-04 NOTE — Discharge Summary (Addendum)
Patient ID: Hermelinda DellenHarold M Ferber MRN: [id number] DOB/AGE: 83/12/1930 83 y.o.  Admit date: 07/02/2019 Discharge date: 07/09/2019  Admission Diagnoses:  Principal Problem:   Degenerative arthritis of left knee Active Problems:   S/P total knee arthroplasty, left   Status post total left knee replacement   Pressure injury of skin   Discharge Diagnoses:  Same  Past Medical History:  Diagnosis Date  . Arthritis   . Enlarged prostate   . Falls 06/27/2019   last fall was 2 weeks ago   . Heart murmur   . History of kidney stones    only one as a young man , passed independently   . HTN (hypertension)     Surgeries: Procedure(s): Left Knee Arthroplasty on 07/02/2019   Consultants:   Discharged Condition: Improved  Hospital Course: Hermelinda DellenHarold M Tourigny is an 85 y.o. male who was admitted 07/02/2019 for operative treatment ofDegenerative arthritis of left knee. Patient has severe unremitting pain that affects sleep, daily activities, and work/hobbies. After pre-op clearance the patient was taken to the operating room on 07/02/2019 and underwent  Procedure(s): Left Knee Arthroplasty.    Patient was given perioperative antibiotics:  Anti-infectives (From admission, onward)   Start     Dose/Rate Route Frequency Ordered Stop   07/02/19 0600  ceFAZolin (ANCEF) IVPB 2g/100 mL premix     2 g 200 mL/hr over 30 Minutes Intravenous On call to O.R. 07/02/19 0539 07/02/19 0735       Patient was given sequential compression devices, early ambulation, and chemoprophylaxis to prevent DVT.  Patient benefited maximally from hospital stay and there were no complications.    Recent vital signs:  Patient Vitals for the past 24 hrs:  BP Temp Temp src Pulse Resp SpO2  07/09/19 0816 99/64 - - 64 - -  07/09/19 0531 (!) 113/59 98.2 F (36.8 C) Oral 71 16 97 %  07/08/19 2139 135/62 97.8 F (36.6 C) Oral 62 16 97 %  07/08/19 1634 120/61 - - 62 - -  07/08/19 1433 114/63 - - (!) 59 16 95 %     Recent  laboratory studies:  Recent Labs    07/07/19 0932  WBC 9.3  HGB 11.6*  HCT 35.4*  PLT 198     Discharge Medications:   Allergies as of 07/09/2019   No Known Allergies     Medication List    TAKE these medications   aspirin EC 81 MG tablet Take 1 tablet (81 mg total) by mouth 2 (two) times daily.   carvedilol 25 MG tablet Commonly known as: COREG Take 25 mg by mouth 2 (two) times daily.   diltiazem 300 MG 24 hr capsule Commonly known as: CARDIZEM CD Take 300 mg by mouth daily.   finasteride 5 MG tablet Commonly known as: PROSCAR Take 5 mg by mouth at bedtime.   hydrochlorothiazide 25 MG tablet Commonly known as: HYDRODIURIL Take 25 mg by mouth daily.   IMMUNE SUPPORT VITAMIN C PO Take 1 tablet by mouth daily. L'il critter   lisinopril 40 MG tablet Commonly known as: ZESTRIL Take 40 mg by mouth daily.   oxyCODONE-acetaminophen 5-325 MG tablet Commonly known as: PERCOCET/ROXICET Take 1 tablet by mouth every 4 (four) hours as needed for severe pain.   spironolactone 25 MG tablet Commonly known as: ALDACTONE Take 25 mg by mouth daily.   tamsulosin 0.4 MG Caps capsule Commonly known as: FLOMAX Take 0.4 mg by mouth at bedtime.   tiZANidine 2 MG tablet Commonly known  as: ZANAFLEX Take 1 tablet (2 mg total) by mouth every 6 (six) hours as needed.            Durable Medical Equipment  (From admission, onward)         Start     Ordered   07/06/19 1555  For home use only DME high strength lightweight manual wheelchair with seat cushion  Once    Comments: Patient suffers from Severe arthritis of right knee and is status post left knee replacement which impairs their ability to perform daily activities like dressing, grooming and toileting in the home.  A walker will not resolve  issue with performing activities of daily living. A wheelchair will allow patient to safely perform daily activities.Length of need 6 months . (THEN ONE OF THESE TWO:) Patient  self-propels the wheelchair while engaging in frequent activities such as meals and toileting which cannot be performed in a standard or lightweight wheelchair due to the weight of the chair. Accessories: elevating leg rests (ELRs), wheel locks, extensions and anti-tippers.   07/06/19 1556   07/02/19 1022  DME Walker rolling  Once    Question:  Patient needs a walker to treat with the following condition  Answer:  Status post total left knee replacement   07/02/19 1021   07/02/19 1022  DME 3 n 1  Once     07/02/19 1021           Discharge Care Instructions  (From admission, onward)         Start     Ordered   07/09/19 0000  Weight bearing as tolerated     07/09/19 1207   07/04/19 0000  Weight bearing as tolerated     07/04/19 1010          Diagnostic Studies: Dg Chest 2 View  Result Date: 06/27/2019 CLINICAL DATA:  85 year old male under preoperative evaluation prior to knee surgery. EXAM: CHEST - 2 VIEW COMPARISON:  Chest x-ray 06/04/2009. FINDINGS: Areas of linear scarring are noted in the lung bases bilaterally, similar to the prior study. No acute consolidative airspace disease. No pleural effusions. No suspicious appearing pulmonary nodule or mass confidently identified. No pneumothorax. No evidence of pulmonary edema. Heart size is normal. Upper mediastinal contours are within normal limits. Aortic atherosclerosis. IMPRESSION: 1. No radiographic evidence of acute cardiopulmonary disease. 2. Bibasilar areas of scarring, similar to the prior study. 3. Aortic atherosclerosis. Electronically Signed   By: Vinnie Langton M.D.   On: 06/27/2019 16:51    Disposition: Discharge disposition: 01-Home or Self Care       Discharge Instructions    Call MD / Call 911   Complete by: As directed    If you experience chest pain or shortness of breath, CALL 911 and be transported to the hospital emergency room.  If you develope a fever above 101 F, pus (white drainage) or increased  drainage or redness at the wound, or calf pain, call your surgeon's office.   Call MD / Call 911   Complete by: As directed    If you experience chest pain or shortness of breath, CALL 911 and be transported to the hospital emergency room.  If you develope a fever above 101 F, pus (white drainage) or increased drainage or redness at the wound, or calf pain, call your surgeon's office.   Constipation Prevention   Complete by: As directed    Drink plenty of fluids.  Prune juice may be helpful.  You may  use a stool softener, such as Colace (over the counter) 100 mg twice a day.  Use MiraLax (over the counter) for constipation as needed.   Constipation Prevention   Complete by: As directed    Drink plenty of fluids.  Prune juice may be helpful.  You may use a stool softener, such as Colace (over the counter) 100 mg twice a day.  Use MiraLax (over the counter) for constipation as needed.   Diet - low sodium heart healthy   Complete by: As directed    Driving restrictions   Complete by: As directed    No driving for 2 weeks   Driving restrictions   Complete by: As directed    No driving for 2 weeks   Increase activity slowly as tolerated   Complete by: As directed    Increase activity slowly as tolerated   Complete by: As directed    Patient may shower   Complete by: As directed    You may shower without a dressing once there is no drainage.  Do not wash over the wound.  If drainage remains, cover wound with plastic wrap and then shower.   Patient may shower   Complete by: As directed    You may shower without a dressing once there is no drainage.  Do not wash over the wound.  If drainage remains, cover wound with plastic wrap and then shower.   Weight bearing as tolerated   Complete by: As directed    Weight bearing as tolerated   Complete by: As directed       Follow-up Information    Gean Birchwood, MD. Go on 07/17/2019.   Specialty: Orthopedic Surgery Why: Your appointment has been  scheduled for 9:30  Contact information: [address] [place] [address] [phone]        [facility] Follow up.   Specialty: Home Health Services Why: You will have 5 HHPT visits prior to starting outpatient physical therapy  Contact information: [address] [place] [address] [phone]        [facility], [place]. Go on 07/17/2019.   Why: You are scheduled to start outpatient physical therapy at 11:20. Directly after your physican visit. Please go directly to the therapy side to complete your paperwork.  Contact information: Physical Therapy [address] [place] [address] [phone]            Signed: Dannielle Burn 07/09/2019, 12:08 PM

## 2019-07-04 NOTE — Progress Notes (Signed)
Physical Therapy Treatment Patient Details Name: Gilbert Morse MRN: [id number] DOB: 31-Aug-1930 Today's Date: 07/04/2019    History of Present Illness Patient is 85 y.o. male s/p Lt TKA on 07/02/19 with PMH significant HTN, history of falls, and OA.    PT Comments    POD #2 pm session. Pt demonstrated great motivation today during therapy. Pt BP remained too low to ambulate too much.   Sitting: BP 94/52 HR 64 Oxygen sats 95% Standing: BP 86/72 HR 76  General bed mobility comments: Pt OOB in recliner and was returned to recliner after session General transfer comment: Pt required max assist to perform a sit to stand. Pt was not able to bear very much weight through either LE. BP still dropping when going to stand General Gait Details: Pt was able to take one step with a RW before having to sit down due to knees giving out. Pt required max assist +2 for physical support   Follow Up Recommendations  Follow surgeon's recommendation for DC plan and follow-up therapies     Equipment Recommendations  None recommended by PT    Recommendations for Other Services       Precautions / Restrictions Precautions Precautions: Fall Restrictions Weight Bearing Restrictions: No    Mobility  Bed Mobility Overal bed mobility: Needs Assistance Bed Mobility: Supine to Sit     Supine to sit: Min assist     General bed mobility comments: Pt OOB in recliner and was returned to recliner after session  Transfers Overall transfer level: Needs assistance Equipment used: Rolling walker (2 wheeled) Transfers: Sit to/from Stand Sit to Stand: Max assist;+2 physical assistance;+2 safety/equipment         General transfer comment: Pt required max assist to perform a sit to stand. Pt was not able to bear very much weight through either LE. BP still dropping when going to stand  Ambulation/Gait Ambulation/Gait assistance: Max assist;+2 physical assistance Gait Distance (Feet): 1 Feet Assistive  device: Rolling walker (2 wheeled) Gait Pattern/deviations: Step-to pattern;Decreased stride length;Decreased step length - right;Decreased step length - left;Decreased stance time - left;Trunk flexed Gait velocity: decreased   General Gait Details: Pt was able to take one step with a RW before having to sit down due to knees giving out. Pt required max assist +2 for physical support   Stairs             Wheelchair Mobility    Modified Rankin (Stroke Patients Only)       Balance                                            Cognition Arousal/Alertness: Awake/alert Behavior During Therapy: WFL for tasks assessed/performed Overall Cognitive Status: Within Functional Limits for tasks assessed                                        Exercises Total Joint Exercises Ankle Circles/Pumps: AROM;15 reps;Seated;Both Quad Sets: AROM;Supine;Left;5 reps Short Arc QuadSinclair Ship;Left;5 reps;Seated Heel Slides: AAROM;Seated;Left;5 reps Hip ABduction/ADduction: AAROM;Seated;Left;5 reps    General Comments        Pertinent Vitals/Pain Pain Assessment: Faces Faces Pain Scale: Hurts little more Pain Location: L knee pain, back pain Pain Descriptors / Indicators: Discomfort;Aching;Sore;Tightness Pain Intervention(s): Monitored during session;Repositioned;Ice applied  Home Living                      Prior Function            PT Goals (current goals can now be found in the care plan section) Progress towards PT goals: Progressing toward goals    Frequency    7X/week      PT Plan Current plan remains appropriate    Co-evaluation              AM-PAC PT "6 Clicks" Mobility   Outcome Measure  Help needed turning from your back to your side while in a flat bed without using bedrails?: A Lot Help needed moving from lying on your back to sitting on the side of a flat bed without using bedrails?: A Lot Help needed moving to  and from a bed to a chair (including a wheelchair)?: A Lot Help needed standing up from a chair using your arms (e.g., wheelchair or bedside chair)?: A Lot Help needed to walk in hospital room?: Total Help needed climbing 3-5 steps with a railing? : Total 6 Click Score: 10    End of Session Equipment Utilized During Treatment: Gait belt Activity Tolerance: Patient tolerated treatment well;Other (comment)(pt limited by low BP and B LE weakness) Patient left: in chair;with chair alarm set;with family/visitor present;with call bell/phone within reach Nurse Communication: Mobility status PT Visit Diagnosis: Muscle weakness (generalized) (M62.81);Difficulty in walking, not elsewhere classified (R26.2)     Time: 1749-4496 PT Time Calculation (min) (ACUTE ONLY): 33 min  Charges:  $Gait Training: 8-22 mins $Therapeutic Exercise: 8-22 mins $Therapeutic Activity: 8-22 mins                     Marti Sleigh, SPTA [facility]

## 2019-07-04 NOTE — Progress Notes (Signed)
PATIENT ID: Gilbert Morse  MRN: [id number]  DOB/AGE:  12/26/1930 / 85 y.o.  2 Days Post-Op Procedure(s) (LRB): Left Knee Arthroplasty (Left)    PROGRESS NOTE Subjective: Patient is alert, oriented, no Nausea, no Vomiting, yes passing gas. Taking PO well. Denies SOB, Chest or Calf Pain. Using Incentive Spirometer, PAS in place. Ambulate WBAT with pt working on transfers , Patient reports pain as moderate .    Objective: Vital signs in last 24 hours: Vitals:   07/03/19 1849 07/03/19 2045 07/03/19 2201 07/04/19 0550  BP: 104/61  126/69 136/75  Pulse: 61 68 70 72  Resp: 15 18 18 18   Temp:   97.9 F (36.6 C) 98.2 F (36.8 C)  TempSrc:      SpO2: 99%  95% 95%  Weight:      Height:          Intake/Output from previous day: I/O last 3 completed shifts: In: 2674.2 [P.O.:780; I.V.:1894.2] Out: 1800 [UJWJX:9147]   Intake/Output this shift: No intake/output data recorded.   LABORATORY DATA: Recent Labs    07/03/19 0301 07/04/19 0206  WBC 12.3* 18.7*  HGB 11.1* 10.0*  HCT 34.6* 31.5*  PLT 196 174  NA 130*  --   K 4.2  --   CL 97*  --   CO2 27  --   BUN 25*  --   CREATININE 1.16  --   GLUCOSE 148*  --   CALCIUM 8.2*  --     Examination: Neurologically intact Neurovascular intact Sensation intact distally Intact pulses distally Dorsiflexion/Plantar flexion intact Incision: dressing C/D/I and no drainage No cellulitis present Compartment soft}  Assessment:   2 Days Post-Op Procedure(s) (LRB): Left Knee Arthroplasty (Left) ADDITIONAL DIAGNOSIS: Expected Acute Blood Loss Anemia, Hypertension and BPH Anticipated LOS equal to or greater than 2 midnights due to - Age 26 and older with one or more of the following:  - Obesity  - Expected need for hospital services (PT, OT, Nursing) required for safe  discharge  - Anticipated need for postoperative skilled nursing care or inpatient rehab   OR   - Unanticipated findings during/Post Surgery: Slow post-op progression:  GI, pain control, mobility    Plan: PT/OT WBAT, AROM and PROM  DVT Prophylaxis:  SCDx72hrs, ASA 81 mg BID x 2 weeks DISCHARGE PLAN: Home, when pt passes therapy goals DISCHARGE NEEDS: HHPT, Walker and 3-in-1 comode seat     Gilbert Morse 07/04/2019, 10:07 AM

## 2019-07-04 NOTE — Plan of Care (Signed)
Plan of care reviewed and discussed with the patient. 

## 2019-07-05 LAB — CBC
HCT: 26.9 % — ABNORMAL LOW (ref 39.0–52.0)
Hemoglobin: 8.5 g/dL — ABNORMAL LOW (ref 13.0–17.0)
MCH: 28.8 pg (ref 26.0–34.0)
MCHC: 31.6 g/dL (ref 30.0–36.0)
MCV: 91.2 fL (ref 80.0–100.0)
Platelets: 169 10*3/uL (ref 150–400)
RBC: 2.95 MIL/uL — ABNORMAL LOW (ref 4.22–5.81)
RDW: 15.2 % (ref 11.5–15.5)
WBC: 12.4 10*3/uL — ABNORMAL HIGH (ref 4.0–10.5)
nRBC: 0 % (ref 0.0–0.2)

## 2019-07-05 MED ORDER — DEXTROSE 5 % AND 0.45 % NACL IV BOLUS
500.0000 mL | Freq: Once | INTRAVENOUS | Status: AC
  Administered 2019-07-05: 500 mL via INTRAVENOUS

## 2019-07-05 NOTE — Progress Notes (Signed)
PATIENT ID: Gilbert Morse  MRN: [id number]  DOB/AGE:  1931-02-11 / 85 y.o.  3 Days Post-Op Procedure(s) (LRB): Left Knee Arthroplasty (Left)    PROGRESS NOTE Subjective: Patient is alert, oriented, no Nausea, no Vomiting, yes passing gas. Taking PO well. Denies SOB, Chest or Calf Pain. Using Incentive Spirometer, PAS in place. Ambulate WBAT with pt walking 5 ft, Patient reports pain as moderate .    Objective: Vital signs in last 24 hours: Vitals:   07/05/19 0135 07/05/19 0321 07/05/19 0609 07/05/19 0730  BP: (!) 95/43 (!) 110/49 112/66   Pulse: (!) 57 61 66   Resp: 14 14 16    Temp: 98.1 F (36.7 C)     TempSrc:      SpO2: 92% 95% 94%   Weight:    89 kg  Height:    6' (1.829 m)      Intake/Output from previous day: I/O last 3 completed shifts: In: 1838.8 [P.O.:1320; I.V.:518.8] Out: 1875 [Urine:1875]   Intake/Output this shift: Total I/O In: -  Out: 200 [Urine:200]   LABORATORY DATA: Recent Labs    07/03/19 0301 07/04/19 0206 07/05/19 0258  WBC 12.3* 18.7* 12.4*  HGB 11.1* 10.0* 8.5*  HCT 34.6* 31.5* 26.9*  PLT 196 174 169  NA 130*  --   --   K 4.2  --   --   CL 97*  --   --   CO2 27  --   --   BUN 25*  --   --   CREATININE 1.16  --   --   GLUCOSE 148*  --   --   CALCIUM 8.2*  --   --     Examination: Neurologically intact Neurovascular intact Sensation intact distally Intact pulses distally Dorsiflexion/Plantar flexion intact Incision: dressing C/D/I and no drainage No cellulitis present Compartment soft}  Assessment:   3 Days Post-Op Procedure(s) (LRB): Left Knee Arthroplasty (Left) ADDITIONAL DIAGNOSIS: Expected Acute Blood Loss Anemia, Hypertension and BPH Anticipated LOS equal to or greater than 2 midnights due to - Age 38 and older with one or more of the following:  - Obesity  - Expected need for hospital services (PT, OT, Nursing) required for safe  discharge  - Anticipated need for postoperative skilled nursing care or inpatient  rehab   OR   - Unanticipated findings during/Post Surgery: Slow post-op progression: GI, pain control, mobility       Plan: PT/OT WBAT, AROM and PROM  DVT Prophylaxis:  SCDx72hrs, ASA 81 mg BID x 2 weeks DISCHARGE PLAN: Home DISCHARGE NEEDS: HHPT, Walker and 3-in-1 comode seat     [name] 07/05/2019, 9:06 AM

## 2019-07-05 NOTE — Progress Notes (Signed)
After bolus, BP is now 110/49. MD paged to update. MD instructed RN to try to only give tylenol for pain and hold BP meds, and Dr. Mayer Camel will reassess in the AM.

## 2019-07-05 NOTE — Progress Notes (Signed)
Physical Therapy Treatment Patient Details Name: Gilbert Morse MRN: [id number] DOB: [birth year] Today's Date: 07/05/2019    History of Present Illness Patient is 85 y.o. male s/p Lt TKA on 07/02/19 with PMH significant HTN, history of falls, and OA.    PT Comments    POD #3 pm session. Pt demonstrated good motivation during today's session. Pt limited by B LE weakness. Pt son was present for treatment.  General bed mobility comments: Pt was OOB in recliner and then went to bed after session. Pt required VC's and physical assistance to get B LE up on the bed. Pt was able to hold himself in sitting posisiton before laying down General transfer comment: Pt required mod-max assist +2 to stand and to piviot from the recliner to the bed. Pt was unsteady and B knees were buckling General Gait Details: Pt was only able to ambulate 19ft with a RW due to B knee buckling. Pt required 2+ physical assist to maintain an upright position. Pt unable to extend B hips or B knees. Pt required manual assist to push hips forward. Pt and son were shown exercises and demonstrated understanding.   Follow Up Recommendations  SNF     Equipment Recommendations  None recommended by PT    Recommendations for Other Services       Precautions / Restrictions Precautions Precautions: Fall Restrictions Weight Bearing Restrictions: No    Mobility  Bed Mobility Overal bed mobility: Needs Assistance Bed Mobility: Supine to Sit     Supine to sit: Min assist Sit to supine: +2 for physical assistance;+2 for safety/equipment;Mod assist;Max assist   General bed mobility comments: Pt was OOB in recliner and then went to bed after session. Pt required VC's and physical assistance to get B LE up on the bed. Pt was able to hold himself in sitting posisiton before laying down  Transfers Overall transfer level: Needs assistance Equipment used: Rolling walker (2 wheeled) Transfers: Stand Pivot Transfers;Sit to/from  Stand Sit to Stand: From elevated surface;+2 physical assistance;+2 safety/equipment;Max assist;Mod assist Stand pivot transfers: Mod assist;+2 safety/equipment;+2 physical assistance;Max assist       General transfer comment: Pt required mod-max assist +2 to stand and to piviot from the recliner to the bed. Pt was unsteady and B knees were buckling  Ambulation/Gait Ambulation/Gait assistance: Mod assist;Max assist;+2 physical assistance;+2 safety/equipment Gait Distance (Feet): 5 Feet Assistive device: Rolling walker (2 wheeled) Gait Pattern/deviations: Step-to pattern;Decreased stride length;Decreased step length - right;Decreased step length - left;Decreased stance time - left;Trunk flexed;Shuffle Gait velocity: decreased   General Gait Details: Pt was only able to ambulate 67ft with a RW due to B knee buckling. Pt required 2+ physical assist to maintain an upright position. Pt unable to extend B hips or B knees. Pt required manual assist to push hips forward   Stairs             Wheelchair Mobility    Modified Rankin (Stroke Patients Only)       Balance Overall balance assessment: Needs assistance Sitting-balance support: Feet supported;Bilateral upper extremity supported;Single extremity supported Sitting balance-Leahy Scale: Poor   Postural control: Posterior lean Standing balance support: During functional activity;Bilateral upper extremity supported Standing balance-Leahy Scale: Poor                              Cognition Arousal/Alertness: Awake/alert Behavior During Therapy: WFL for tasks assessed/performed Overall Cognitive Status: Within Functional Limits for tasks assessed  General Comments: HOH and required repeat functional VC's      Exercises Total Joint Exercises Ankle Circles/Pumps: AROM;Both;10 reps;Supine Quad Sets: AROM;Left;10 reps;Supine Short Arc Quad: AAROM;Left;10  reps;Supine Heel Slides: Left;10 reps;AAROM;Supine Hip ABduction/ADduction: AAROM;Left;5 reps;Supine Long Arc Quad: AROM;15 reps;Seated;Both    General Comments        Pertinent Vitals/Pain Pain Assessment: Faces Faces Pain Scale: Hurts even more Pain Location: L knee pain, mostly back pain Pain Descriptors / Indicators: Discomfort;Aching;Sore Pain Intervention(s): Monitored during session;Repositioned;Ice applied    Home Living                      Prior Function            PT Goals (current goals can now be found in the care plan section) Acute Rehab PT Goals Patient Stated Goal: to go back home PT Goal Formulation: With patient Time For Goal Achievement: 07/12/19 Potential to Achieve Goals: Fair Progress towards PT goals: Progressing toward goals    Frequency    Min 5X/week      PT Plan Current plan remains appropriate    Co-evaluation              AM-PAC PT "6 Clicks" Mobility   Outcome Measure  Help needed turning from your back to your side while in a flat bed without using bedrails?: A Little Help needed moving from lying on your back to sitting on the side of a flat bed without using bedrails?: A Lot Help needed moving to and from a bed to a chair (including a wheelchair)?: A Lot Help needed standing up from a chair using your arms (e.g., wheelchair or bedside chair)?: A Lot Help needed to walk in hospital room?: Total Help needed climbing 3-5 steps with a railing? : Total 6 Click Score: 11    End of Session Equipment Utilized During Treatment: Gait belt Activity Tolerance: Patient tolerated treatment well;Other (comment) Patient left: in bed;with call bell/phone within reach;with bed alarm set;with family/visitor present Nurse Communication: Mobility status PT Visit Diagnosis: Muscle weakness (generalized) (M62.81);Difficulty in walking, not elsewhere classified (R26.2)     Time: 8250-5397 PT Time Calculation (min) (ACUTE ONLY):  24 min  Charges:  $Gait Training: 8-22 mins $Therapeutic Exercise: 8-22 mins $Therapeutic Activity: 8-22 mins                     Excell Seltzer, [facility]

## 2019-07-05 NOTE — Care Plan (Signed)
Continuing to follow for progression. Slow to progress due to blood pressure issues. Plan noted. Discharge plan remains in place.   Ladell Heads, [place]

## 2019-07-05 NOTE — Progress Notes (Signed)
Pt BP 95/43 with pulse 57 at rest. On call MD paged and MD ordered 500 ml bolus. Will continue to monitor.

## 2019-07-05 NOTE — Progress Notes (Addendum)
Physical Therapy Treatment/Re-Assessment Patient Details Name: Gilbert Morse MRN: [id number] DOB: 24-Nov-1930 Today's Date: 07/05/2019    History of Present Illness Patient is 85 y.o. male s/p Lt TKA on 07/02/19 with PMH significant HTN, history of falls, and OA.    PT Comments    Patient has been limited in making progress with therapy secondary to hypotension and bil LE weakness. Patient continues to require multimodal cues for sequencing bed mob, transfers, and gait with RW. He was limited greatly by weakness in bil LE's today and required mod-max assist +2 person support for transfers and gait. Pt had poor activation of Lt quad and required manual facilitation of extension during stance phase. Pt BP remained stable throughout mobility today (see below details) however he was limited to ambulating ~4 feet due to LE weakness. Due to limited progress pt's goals have been updated to Min assist level and recommending SNF for follow up therapy and 24/7 assist. He will continue to benefit from skilled PT to progress functional mobility. Acute PT will continue to progress as able.   Vitals: Supine (11:13):  BP: 117/69, HR: 67 Seated (11:20):  BP: 126/54, HR: 80 Standing (11:22):  BP: 135/62, HR: 72 Walking (11:27):  BP: 108/55, HR: 84 Seated (11:42):  BP: 113/61, HR: 70    Follow Up Recommendations  SNF(pt is unsafe to return home at this time)   Barriers to Discharge: pt lives with spouse alone and she is unable to physically provide assistance required for mobility. His son is leaving Friday 07/06/19 to return to [facility] and cannot return to provide any supervision or assistance. Pt currently requires 2+ assist for functional transfers and gait with RW and is unsafe to return home. Recommend SNF at this time.     Equipment Recommendations  None recommended by PT    Recommendations for Other Services       Precautions / Restrictions Precautions Precautions: Fall Restrictions Weight  Bearing Restrictions: No    Mobility  Bed Mobility Overal bed mobility: Needs Assistance Bed Mobility: Supine to Sit     Supine to sit: Min assist     General bed mobility comments: pt required repeated verbal/tactil cues to sequence supine to Lt side and to press up with to raise trunk, min assist required to sit up and mod assist required to scoot forward and place feet on floor (pt with posterior lean in sitting limiting pt's ability to scoot forward)  Transfers Overall transfer level: Needs assistance Equipment used: Rolling walker (2 wheeled) Transfers: Stand Pivot Transfers;Sit to/from Stand Sit to Stand: Mod assist;From elevated surface;+2 physical assistance;+2 safety/equipment Stand pivot transfers: Mod assist;+2 safety/equipment;+2 physical assistance;Max assist       General transfer comment: pt required mod assist from elevated surface or max from low surface with 2 person for safety/physical assist. short stepping of gait  performed with RW for stand step/pivot trasnfer.  Ambulation/Gait Ambulation/Gait assistance: Mod assist;Max assist;+2 physical assistance;+2 safety/equipment Gait Distance (Feet): 4 Feet Assistive device: Rolling walker (2 wheeled) Gait Pattern/deviations: Step-to pattern;Decreased stride length;Decreased step length - right;Decreased step length - left;Decreased stance time - left;Trunk flexed;Shuffle Gait velocity: decreased   General Gait Details: pt taking small steps with hip/trunk flexion and bil knees flexed, pt unable to fully extend at hips or knees for upright posture. pt required max assist +2 for physial support to maintain standing. pt also required manual facilitation at Lt knee to facilitate extension and prevent buckling with stance phase.   Stairs  Wheelchair Mobility    Modified Rankin (Stroke Patients Only)       Balance Overall balance assessment: Needs assistance Sitting-balance support: Feet  supported;Bilateral upper extremity supported;Single extremity supported Sitting balance-Leahy Scale: Poor   Postural control: Posterior lean Standing balance support: During functional activity;Bilateral upper extremity supported Standing balance-Leahy Scale: Poor           Cognition Arousal/Alertness: Awake/alert Behavior During Therapy: WFL for tasks assessed/performed Overall Cognitive Status: Within Functional Limits for tasks assessed        General Comments: HOH and required repeat functional VC's      Exercises Total Joint Exercises Quad Sets: AROM;Left;15 reps;Seated Heel Slides: Seated;Left;10 reps;AAROM Long Arc Quad: AROM;15 reps;Seated;Both    General Comments        Pertinent Vitals/Pain Pain Assessment: Faces Faces Pain Scale: Hurts little more Pain Location: L knee pain, back pain Pain Descriptors / Indicators: Discomfort;Aching;Sore Pain Intervention(s): Monitored during session;Limited activity within patient's tolerance;Repositioned;Ice applied           PT Goals (current goals can now be found in the care plan section) Acute Rehab PT Goals Patient Stated Goal: to go back home PT Goal Formulation: With patient Time For Goal Achievement: 07/12/19 Potential to Achieve Goals: Fair Progress towards PT goals: Goals downgraded-see care plan(pt is not progressed to supervision level goals, goals updated to min assist level)    Frequency    Min 5X/week      PT Plan Frequency needs to be updated;Discharge plan needs to be updated(pt will benefit from SNF placement and frequency can be reduced)       AM-PAC PT "6 Clicks" Mobility   Outcome Measure  Help needed turning from your back to your side while in a flat bed without using bedrails?: A Little Help needed moving from lying on your back to sitting on the side of a flat bed without using bedrails?: A Lot Help needed moving to and from a bed to a chair (including a wheelchair)?: A Lot Help  needed standing up from a chair using your arms (e.g., wheelchair or bedside chair)?: A Lot Help needed to walk in hospital room?: Total Help needed climbing 3-5 steps with a railing? : Total 6 Click Score: 11    End of Session Equipment Utilized During Treatment: Gait belt Activity Tolerance: Patient tolerated treatment well;Other (comment)(pt limited by B LE weakness this session, history of low BP as well) Patient left: in chair;with chair alarm set;with family/visitor present;with call bell/phone within reach Nurse Communication: Mobility status PT Visit Diagnosis: Muscle weakness (generalized) (M62.81);Difficulty in walking, not elsewhere classified (R26.2)     Time: 4097-3532 PT Time Calculation (min) (ACUTE ONLY): 35 min  Charges:  $Therapeutic Exercise: 8-22 mins $Therapeutic Activity: 8-22 mins                     Kipp Brood, PT, DPT Physical Therapist with [facility]  07/05/2019 12:45 PM

## 2019-07-06 LAB — PREPARE RBC (CROSSMATCH)

## 2019-07-06 MED ORDER — SODIUM CHLORIDE 0.9% IV SOLUTION
Freq: Once | INTRAVENOUS | Status: AC
  Administered 2019-07-06: 11:00:00 via INTRAVENOUS

## 2019-07-06 NOTE — Progress Notes (Signed)
PATIENT ID: Gilbert Morse  MRN: [id number]  DOB/AGE:  Sep 20, 1930 / 85 y.o.  4 Days Post-Op Procedure(s) (LRB): Left Knee Arthroplasty (Left)    PROGRESS NOTE Subjective: Patient is alert, oriented, no Nausea, no Vomiting, yes passing gas. Taking PO well. Denies SOB, Chest or Calf Pain. Using Incentive Spirometer, PAS in place. Ambulate WBAT with pt walking 5 ft with therapy, Patient reports pain as moderate.  Pt's therapy has been limited due to fatigue.    Objective: Vital signs in last 24 hours: Vitals:   07/05/19 1426 07/05/19 1817 07/05/19 2135 07/06/19 0632  BP: 125/64 128/62 140/61 121/63  Pulse: 70 68 70 76  Resp: 17 16 16 19   Temp: 97.6 F (36.4 C) 97.8 F (36.6 C) 98.3 F (36.8 C) 98.4 F (36.9 C)  TempSrc:   Oral Oral  SpO2: 97% 95% 100% 95%  Weight:      Height:          Intake/Output from previous day: I/O last 3 completed shifts: In: 2968.6 [P.O.:1160; I.V.:1808.6] Out: 4725 [Urine:4725]   Intake/Output this shift: No intake/output data recorded.   LABORATORY DATA: Recent Labs    07/04/19 0206 07/05/19 0258  WBC 18.7* 12.4*  HGB 10.0* 8.5*  HCT 31.5* 26.9*  PLT 174 169    Examination: Neurologically intact Neurovascular intact Sensation intact distally Intact pulses distally Dorsiflexion/Plantar flexion intact Incision: dressing C/D/I and scant drainage No cellulitis present Compartment soft}  Assessment:   4 Days Post-Op Procedure(s) (LRB): Left Knee Arthroplasty (Left) ADDITIONAL DIAGNOSIS: Expected Acute Blood Loss Anemia, Acute Blood Loss Anemia and Hypertension Anticipated LOS equal to or greater than 2 midnights due to - Age 52 and older with one or more of the following:  - Obesity  - Expected need for hospital services (PT, OT, Nursing) required for safe  discharge  - Anticipated need for postoperative skilled nursing care or inpatient rehab  OR   - Unanticipated findings during/Post Surgery: Slow post-op progression: GI, pain  control, mobility    Plan: PT/OT WBAT, AROM and PROM  DVT Prophylaxis:  SCDx72hrs, ASA 81 mg BID x 2 weeks DISCHARGE PLAN: Home DISCHARGE NEEDS: HHPT, Walker and 3-in-1 comode seat   Will give 2 units of PRBC's due to symptomatic anemia and hypotension.     [name] 07/06/2019, 8:46 AM

## 2019-07-06 NOTE — Care Management Important Message (Signed)
Important Message  Patient Details IM Letter given to Kathrin Greathouse SW to present to the Patient Name: KAIRYN OLMEDA MRN: [id number] Date of Birth: November 18, 1930   Medicare Important Message Given:  Yes     Kerin Salen 07/06/2019, 1:31 PM

## 2019-07-06 NOTE — Progress Notes (Signed)
Physical Therapy Treatment Patient Details Name: Gilbert Morse MRN: [id number] DOB: 03/07/1931 Today's Date: 07/06/2019    History of Present Illness Patient is 85 y.o. male s/p Lt TKA on 07/02/19 with PMH significant HTN, history of falls, and OA.    PT Comments    POD #4  Pt progressing slolwy and still requires + 2 assist to amb.  BP are better.  Pt c/o max weakness.  Assisted OOB.  General bed mobility comments: required increased time and assist to complete upper body as well as use of pad to comp-lete scooting to EOB   (Spouse stated pt sleeps in a recliner) General transfer comment: Pt required mod-max assist +2 to stand and to piviot from elevated  bed. Pt was unsteady and B knees were buckling.  Poor forward flex posture.  General Gait Details: pt progressing slolwy and still requires + 2 assist.  B knees buckle.  Limited distance of 8 feet with recliner following closely behind.   Follow Up Recommendations  SNF per LPT  (per spouse pt will D/C to home and agrees to ambulance transport.  She is also hiring "extra help")  Has a walker, has a 3:1 NEEDS a wheelchair   Geophysical data processor (measurements PT)    Recommendations for Other Services       Precautions / Restrictions Precautions Precautions: Fall;Knee Restrictions Weight Bearing Restrictions: No    Mobility  Bed Mobility Overal bed mobility: Needs Assistance Bed Mobility: Supine to Sit     Supine to sit: Min assist;Mod assist     General bed mobility comments: required increased time and assist to complete upper body as well as use of pad to comp-lete scooting to EOB   (Spouse stated pt sleeps in a recliner)  Transfers Overall transfer level: Needs assistance Equipment used: Rolling walker (2 wheeled) Transfers: Stand Pivot Transfers;Sit to/from Stand Sit to Stand: From elevated surface;+2 physical assistance;+2 safety/equipment;Max assist;Mod assist Stand pivot transfers: Mod  assist;+2 safety/equipment;+2 physical assistance;Max assist       General transfer comment: Pt required mod-max assist +2 to stand and to piviot from elevated  bed. Pt was unsteady and B knees were buckling.  Poor forward flex posture  Ambulation/Gait Ambulation/Gait assistance: Mod assist;Max assist;+2 physical assistance;+2 safety/equipment Gait Distance (Feet): 8 Feet Assistive device: Rolling walker (2 wheeled) Gait Pattern/deviations: Step-to pattern;Decreased stride length;Decreased step length - right;Decreased step length - left;Decreased stance time - left;Trunk flexed;Shuffle Gait velocity: decreased   General Gait Details: pt progressing slolwy and still requires + 2 assist.  B knees buckle.   Stairs Stairs: (spouse agrees pt needs to D/C to home via [facility]AR)           Wheelchair Mobility    Modified Rankin (Stroke Patients Only)       Balance                                            Cognition Arousal/Alertness: Awake/alert                                     General Comments: HOH and required repeat functional VC's with increased time      Exercises   Total Knee Replacement TE's 10 reps B LE ankle pumps 5 reps towel squeezes 5 reps knee presses  5 reps heel slides  5 reps SAQ's 5 reps SLR's 5 reps ABD Followed by ICE    General Comments        Pertinent Vitals/Pain Pain Assessment: Faces Faces Pain Scale: Hurts a little bit Pain Location: L knee pain, mostly back pain Pain Descriptors / Indicators: Discomfort;Aching;Sore;Grimacing Pain Intervention(s): Monitored during session;Repositioned;Ice applied    Home Living                      Prior Function            PT Goals (current goals can now be found in the care plan section) Progress towards PT goals: Progressing toward goals    Frequency    Min 5X/week      PT Plan Current plan remains appropriate    Co-evaluation               AM-PAC PT "6 Clicks" Mobility   Outcome Measure  Help needed turning from your back to your side while in a flat bed without using bedrails?: A Lot Help needed moving from lying on your back to sitting on the side of a flat bed without using bedrails?: A Lot Help needed moving to and from a bed to a chair (including a wheelchair)?: A Lot Help needed standing up from a chair using your arms (e.g., wheelchair or bedside chair)?: A Lot Help needed to walk in hospital room?: Total Help needed climbing 3-5 steps with a railing? : Total 6 Click Score: 10    End of Session Equipment Utilized During Treatment: Gait belt Activity Tolerance: Patient tolerated treatment well;Other (comment) Patient left: in chair;with call bell/phone within reach;with family/visitor present Nurse Communication: Mobility status PT Visit Diagnosis: Muscle weakness (generalized) (M62.81);Difficulty in walking, not elsewhere classified (R26.2)     Time: 1884-1660 PT Time Calculation (min) (ACUTE ONLY): 32 min  Charges:  $Gait Training: 8-22 mins $Therapeutic Exercise: 8-22 mins                     Rica Koyanagi  PTA Acute  Rehabilitation Services Pager      [phone] Office      [phone]

## 2019-07-07 LAB — CBC
HCT: 35.4 % — ABNORMAL LOW (ref 39.0–52.0)
Hemoglobin: 11.6 g/dL — ABNORMAL LOW (ref 13.0–17.0)
MCH: 29.4 pg (ref 26.0–34.0)
MCHC: 32.8 g/dL (ref 30.0–36.0)
MCV: 89.8 fL (ref 80.0–100.0)
Platelets: 198 10*3/uL (ref 150–400)
RBC: 3.94 MIL/uL — ABNORMAL LOW (ref 4.22–5.81)
RDW: 14.9 % (ref 11.5–15.5)
WBC: 9.3 10*3/uL (ref 4.0–10.5)
nRBC: 0 % (ref 0.0–0.2)

## 2019-07-07 LAB — TYPE AND SCREEN
ABO/RH(D): O POS
Antibody Screen: NEGATIVE
Unit division: 0
Unit division: 0

## 2019-07-07 LAB — BPAM RBC
Blood Product Expiration Date: 202012212359
Blood Product Expiration Date: 202012212359
ISSUE DATE / TIME: 202011201556
ISSUE DATE / TIME: 202011202010
Unit Type and Rh: 5100
Unit Type and Rh: 5100

## 2019-07-07 NOTE — Progress Notes (Signed)
    Home health agencies that serve [phone].        [place] Quality of Patient Care Rating Patient Survey Summary Rating  [facility] [phone]  out of 5 stars 4 out of [place] [phone] 4  out of 5 stars 4 out of [place] [phone] 4 out of 5 stars 4 out of [place] [phone] 4 out of 5 stars 4 out of 5 stars  [facility] [place] [phone] 3  out of 5 stars 4 out of [place] [phone] 3 out of 5 stars 4 out of 5 stars  [facility] [phone] Not Available4 Not Available12  [facility] (336) [phone] 4  out of 5 stars 3 out of [place] [phone] 3  out of 5 stars 4 out of [place] [phone] 4  out of 5 stars 4 out of [place] number Footnote as displayed on [place]  1 This agency provides services under a federal waiver program to non-traditional, chronic long term population.  2 This agency provides services to a special needs population.  3 Not Available.  4 The number of patient episodes for this measure is too small to report.  5 This measure currently does not have data or provider has been certified/recertified for less than 6 months.  6 The national average for this measure is not provided because of state-to-state differences in data collection.  7 Medicare is not displaying rates for this measure for any home health agency, because of an issue with the data.  8 There were problems with the data and they are being corrected.  9 Zero, or very few, patients met the survey's rules for inclusion. The scores shown, if any, reflect a very small number of surveys and may not accurately tell how an  agency is doing.  10 Survey results are based on less than 12 months of data.  11 Fewer than 70 patients completed the survey. Use the scores shown, if any, with caution as the number of surveys may be too low to accurately tell how an agency is doing.  12 No survey results are available for this period.  13 Data suppressed by CMS for one or more quarters.

## 2019-07-07 NOTE — Progress Notes (Signed)
   PATIENT ID: Gilbert Morse   5 Days Post-Op Procedure(s) (LRB): Left Knee Arthroplasty (Left)  Subjective: Doing okay, some discomfort left hip. Up with PT but making slow progress.   Objective:  Vitals:   07/07/19 0538 07/07/19 0853  BP: (!) 147/98 133/65  Pulse: 77 73  Resp: 19   Temp: 98.4 F (36.9 C)   SpO2: 95%     Neurologically intact Neurovascular intact Sensation intact distally Intact pulses distally Dorsiflexion/Plantar flexion intact Incision: dressing C/D/I and scant drainage No cellulitis present Compartment soft  Labs:  Recent Labs    07/05/19 0258  HGB 8.5*   Recent Labs    07/05/19 0258  WBC 12.4*  RBC 2.95*  HCT 26.9*  PLT 169  No results for input(s): NA, K, CL, CO2, BUN, CREATININE, GLUCOSE, CALCIUM in the last 72 hours.  Assessment and Plan: 5 Days Post-Op Procedure(s) (LRB): Left Knee Arthroplasty (Left) ADDITIONAL DIAGNOSIS: Expected Acute Blood Loss Anemia, Acute Blood Loss Anemia and Hypertension- transfued and CBC ordered to recheck hgb Anticipated LOS equal to or greater than 2 midnights due to - Age 31 and older with one or more of the following:             - Obesity             - Expected need for hospital services (PT, OT, Nursing) required for safe   discharge             - Anticipated need for postoperative skilled nursing care or inpatient rehab  OR   - Unanticipated findings during/Post Surgery: Slow post-op progression: GI, pain control, mobility    Plan: PT/OT WBAT, AROM and PROM  DVT Prophylaxis:  SCDx72hrs, ASA 81 mg BID x 2 weeks DISCHARGE PLAN: Home DISCHARGE NEEDS: HHPT, Walker and 3-in-1 comode seat

## 2019-07-07 NOTE — TOC Progression Note (Signed)
Transition of Care [facility]) - Progression Note    Patient Details  Name: Gilbert Morse MRN: [id number] Date of Birth: [birth year]  [facility] CM/SW Contact  Joaquin Courts, RN Phone Number: 07/07/2019, 2:26 PM  Clinical Narrative:    CM spoke with patient at bedside. Patient plans to dc home [facility]. [employer] to ship manual wheelchair to patient's home. Patient was made aware that shipping equipment will result in a 3 day delay for delivery, patient is agreeable to this.     Expected Discharge Plan: [place] Barriers to Discharge: Continued Medical Work up  Expected Discharge Plan and Services Expected Discharge Plan: [place]   Discharge Planning Services: CM Consult Post Acute Care Choice: [place] arrangements for the past 2 months: Single Family Home Expected Discharge Date: 07/04/19               DME Arranged: Youth worker wheelchair with seat cushion DME Agency: [employer] Date DME Agency Contacted: 07/07/19 Time DME Agency Contacted: 63 Representative spoke with at DME Agency: [place]: PT [place]: [facility] [employer] [facility] [employer]         Social Determinants of Health ([place]) Interventions    Readmission Risk Interventions No flowsheet data found.

## 2019-07-07 NOTE — Progress Notes (Signed)
Physical Therapy Treatment Patient Details Name: Gilbert Morse MRN: [id number] DOB: 1931-02-08 Today's Date: 07/07/2019    History of Present Illness Patient is 85 y.o. male s/p Lt TKA on 07/02/19 with PMH significant HTN, history of falls, and OA.    PT Comments    Pt continues to make slow progress with mobility. No buckling of knees today, however did not attempt gait due to not having 2cd person assist this session. Acute PT to continue during pt's hospital stay.    Follow Up Recommendations  SNF;Other (comment)(per spouse pt to d/c home via ambulance and she is hiring "extra help".)     Equipment Recommendations  Wheelchair (measurements PT)       Precautions / Restrictions Precautions Precautions: Fall;Knee    Mobility  Bed Mobility   Bed Mobility: Sit to Supine     Supine to sit: Min assist;HOB elevated     General bed mobility comments: with HOB elevated 40 degrees and rail used. Increased time and effort needed to allow pt to perform most of the work. once upright mod assist needed to scoot closer to edge of bed.  Transfers Overall transfer level: Needs assistance Equipment used: Rolling walker (2 wheeled) Transfers: Sit to/from [employer] Sit to Stand: Max assist;From elevated surface Stand pivot transfers: From elevated surface;Mod assist       General transfer comment: cues on weight shifting and to power up on bil LE's. stood with one person assist today on second attempt with bed elevated significantly. Once standing at RW pt shuffled stepped from bed to chair. Pt with significant forward flexion at hips/trunk and unable to clear/lift feet with stepping. Pt fatigued and needing to sit before fully reaching the chair, therefore PTA used gait belt/facilitation at pelvis to complete transfer to chair so to sit safely into chair. Once in chair pt able to scoot hips back with min assist.      Cognition Arousal/Alertness:  Awake/alert Behavior During Therapy: WFL for tasks assessed/performed Overall Cognitive Status: Within Functional Limits for tasks assessed              General Comments: improved proccessing and following of commands this session      Exercises Total Joint Exercises Ankle Circles/Pumps: AROM;Strengthening;Both;10 reps;Supine Quad Sets: AROM;Strengthening;Left;10 reps;Supine Heel Slides: AAROM;Strengthening;Left;10 reps;Supine Hip ABduction/ADduction: AAROM;Strengthening;Left;10 reps;Supine Straight Leg Raises: AAROM;Strengthening;Left;10 reps;Supine     Pertinent Vitals/Pain Pain Assessment: 0-10 Pain Score: 3  Pain Location: L knee pain, mostly back pain Pain Descriptors / Indicators: Discomfort;Aching;Sore;Grimacing Pain Intervention(s): Limited activity within patient's tolerance;Monitored during session;Premedicated before session;Repositioned     PT Goals (current goals can now be found in the care plan section) Acute Rehab PT Goals Patient Stated Goal: to go back home PT Goal Formulation: With patient Time For Goal Achievement: 07/12/19 Potential to Achieve Goals: Fair Progress towards PT goals: Progressing toward goals    Frequency    Min 5X/week      PT Plan Current plan remains appropriate    AM-PAC PT "6 Clicks" Mobility   Outcome Measure  Help needed turning from your back to your side while in a flat bed without using bedrails?: A Lot Help needed moving from lying on your back to sitting on the side of a flat bed without using bedrails?: A Lot Help needed moving to and from a bed to a chair (including a wheelchair)?: A Lot Help needed standing up from a chair using your arms (e.g., wheelchair or bedside chair)?: A Lot Help  needed to walk in hospital room?: Total Help needed climbing 3-5 steps with a railing? : Total 6 Click Score: 10    End of Session Equipment Utilized During Treatment: Gait belt Activity Tolerance: Patient tolerated  treatment well;Other (comment) Patient left: in chair;with call bell/phone within reach;with family/visitor present   PT Visit Diagnosis: Muscle weakness (generalized) (M62.81);Difficulty in walking, not elsewhere classified (R26.2)     Time: 9728-2060 PT Time Calculation (min) (ACUTE ONLY): 24 min  Charges:  $Gait Training: 8-22 mins $Therapeutic Activity: 8-22 mins                    Willow Ora, PTA, CLT Acute [place] Office phone- [place], [place] 07/07/2019, 1:43 PM

## 2019-07-07 NOTE — Progress Notes (Signed)
CBC ordered again d/t the first sample clotting per lab tech.

## 2019-07-07 NOTE — Progress Notes (Signed)
Physical Therapy Treatment Patient Details Name: Gilbert Morse MRN: [id number] DOB: October 31, 1930 Today's Date: 07/07/2019    History of Present Illness Patient is 85 y.o. male s/p Lt TKA on 07/02/19 with PMH significant HTN, history of falls, and OA.    PT Comments    Pt just back to bed with nursing prior to PTA arrival for pm session. Agreed to bed exercises for strengthening. Acute PT to continue during pt's hospital stay.  Follow Up Recommendations  SNF;Other (comment)(per spouse pt going home via ambulance transport and she is hiring "extra help")     Equipment Recommendations  Wheelchair (measurements PT)    Precautions / Restrictions Precautions Precautions: Fall;Knee Restrictions Weight Bearing Restrictions: No    Mobility  Bed Mobility   Bed Mobility: Sit to Supine     Supine to sit: Min assist;HOB elevated     General bed mobility comments: pt back to bed via nursing prior to pm session.        Cognition Arousal/Alertness: Awake/alert Behavior During Therapy: WFL for tasks assessed/performed Overall Cognitive Status: Within Functional Limits for tasks assessed              General Comments: improved proccessing and following of commands this session      Exercises Total Joint Exercises Ankle Circles/Pumps: AROM;Both;10 reps;Supine Quad Sets: AROM;Strengthening;Left;10 reps;Supine;Limitations Quad Sets Limitations: 5 sec holds Short Arc Quad: AAROM;Strengthening;Left;10 reps;Supine Heel Slides: AAROM;Strengthening;Left;10 reps;Supine Hip ABduction/ADduction: AAROM;Strengthening;Left;10 reps;Supine Straight Leg Raises: AAROM;Strengthening;Left;10 reps;Supine Goniometric ROM: supine in bed: 5- 80 degrees AAROM     Pertinent Vitals/Pain Pain Assessment: 0-10 Pain Score: 3  Pain Location: L knee pain, mostly back pain Pain Descriptors / Indicators: Discomfort;Aching;Sore;Grimacing Pain Intervention(s): Limited activity within patient's  tolerance;Monitored during session;Premedicated before session;Ice applied     PT Goals (current goals can now be found in the care plan section) Acute Rehab PT Goals Patient Stated Goal: to go back home PT Goal Formulation: With patient Time For Goal Achievement: 07/12/19 Potential to Achieve Goals: Fair Progress towards PT goals: (slowly progressing toward goals)    Frequency    Min 5X/week      PT Plan Current plan remains appropriate    AM-PAC PT "6 Clicks" Mobility   Outcome Measure  Help needed turning from your back to your side while in a flat bed without using bedrails?: A Lot Help needed moving from lying on your back to sitting on the side of a flat bed without using bedrails?: A Lot Help needed moving to and from a bed to a chair (including a wheelchair)?: A Lot Help needed standing up from a chair using your arms (e.g., wheelchair or bedside chair)?: A Lot Help needed to walk in hospital room?: Total Help needed climbing 3-5 steps with a railing? : Total 6 Click Score: 10    End of Session Equipment Utilized During Treatment: Gait belt Activity Tolerance: Patient tolerated treatment well Patient left: in bed;with call bell/phone within reach;with bed alarm set Nurse Communication: Mobility status PT Visit Diagnosis: Muscle weakness (generalized) (M62.81)     Time: 0962-8366 PT Time Calculation (min) (ACUTE ONLY): 13 min  Charges:   $Therapeutic Exercise: 8-22 mins           Willow Ora, PTA, CLT Acute Rehab Services- [facility] phone- [phone]            Willow Ora 07/07/2019, 3:39 PM

## 2019-07-08 DIAGNOSIS — L8990 Pressure ulcer of unspecified site, unspecified stage: Secondary | ICD-10-CM | POA: Insufficient documentation

## 2019-07-08 NOTE — Progress Notes (Signed)
      Patient suffers from OA on L knee, which impairs their ability to perform daily activities like walking short trips in the home.  A walker alone will not resolve the issues with performing activities of daily living. A wheelchair will allow patient to safely perform daily activities.  The patient can self propel in the home or has a caregiver who can provide assistance.    Mee Hives, PT MS Acute Rehab Dept. Number: [place]

## 2019-07-08 NOTE — Progress Notes (Signed)
   PATIENT ID: Gilbert Morse   6 Days Post-Op Procedure(s) (LRB): Left Knee Arthroplasty (Left)  Subjective: Doing better, tells me he got up with PT and walked a few more steps. Min knee pain.   Objective:  Vitals:   07/08/19 0517 07/08/19 0843  BP: 137/69 129/68  Pulse: 68 66  Resp: 16   Temp: 98.3 F (36.8 C)   SpO2: 95%      Neurologically intact Neurovascular intact Sensation intact distally Intact pulses distally Dorsiflexion/Plantar flexion intact Incision:dressing C/D/I and scant drainage No cellulitis present Compartment soft  Labs:  Recent Labs    07/07/19 0932  HGB 11.6*   Recent Labs    07/07/19 0932  WBC 9.3  RBC 3.94*  HCT 35.4*  PLT 198  No results for input(s): NA, K, CL, CO2, BUN, CREATININE, GLUCOSE, CALCIUM in the last 72 hours.  Assessment and Plan: 6 Days Post-OpProcedure(s) (LRB): Left Knee Arthroplasty (Left) ADDITIONAL DIAGNOSIS: Expected Acute Blood Loss Anemia,Acute Blood Loss Anemia and Hypertension- transfused and improved Anticipated LOS equal to or greater than 2 midnights due to - Age 83 and older with one or more of the following: - Obesity - Expected need for hospital services (PT, OT, Nursing) required for safedischarge - Anticipated need for postoperative skilled nursing care or inpatient rehab  OR   -Unanticipated findings during/Post Surgery: Slow post-op progression: GI, pain control, mobility   Plan: D/c home when cleared by PT- needs HHPT PT/OT WBAT, AROM and PROM  DVT Prophylaxis: SCDx72hrs, ASA 81 mg BID x 2 weeks DISCHARGE PLAN:Home DISCHARGE NEEDS:HHPT, Walker and 3-in-1 comode seat

## 2019-07-08 NOTE — Progress Notes (Signed)
Physical Therapy Treatment Patient Details Name: Gilbert Morse MRN: [id number] DOB: 09/01/1930 Today's Date: 07/08/2019    History of Present Illness Patient is 85 y.o. male s/p Lt TKA on 07/02/19 with PMH significant HTN, history of falls, and OA.    PT Comments    Pt was seen for mobility and strengthening, noted his control of balance is better with improved L knee ext in functional standing.  He is still requested by PT to go to SNF but per wife is going home with help she has obtained.  Follow acutely as possible, increase gait distances and work on control of L knee with functional postures, including using LLE to help with sitting and standing up.     Follow Up Recommendations  SNF;Other (comment)(home with spouse at her decision)     Geophysical data processor (measurements PT)    Recommendations for Other Services       Precautions / Restrictions Precautions Precautions: Fall;Knee Precaution Booklet Issued: Yes (comment) Restrictions Weight Bearing Restrictions: No    Mobility  Bed Mobility Overal bed mobility: Needs Assistance Bed Mobility: Supine to Sit     Supine to sit: Min assist;HOB elevated     General bed mobility comments: pt was able to move LLE with min assist and is engaging his quads better  Transfers Overall transfer level: Needs assistance Equipment used: Rolling walker (2 wheeled) Transfers: Sit to/from Stand Sit to Stand: Mod assist;From elevated surface         General transfer comment: requires some cued hand placement and control of balance with initial standing, tends to lean forward in a concerning way until more control is attained  Ambulation/Gait Ambulation/Gait assistance: Min assist Gait Distance (Feet): 5 Feet Assistive device: Rolling walker (2 wheeled) Gait Pattern/deviations: Step-to pattern;Shuffle;Decreased stride length;Trunk flexed Gait velocity: decreased   General Gait Details: knee buckling did not  happen, just requires assist to cue upright posture   Stairs             Wheelchair Mobility    Modified Rankin (Stroke Patients Only)       Balance Overall balance assessment: Needs assistance Sitting-balance support: Feet supported;Single extremity supported Sitting balance-Leahy Scale: Fair   Postural control: Posterior lean;Other (comment)(anterior lean) Standing balance support: Bilateral upper extremity supported;During functional activity Standing balance-Leahy Scale: Poor Standing balance comment: requires walker but is offloading onto RLE and controlling balance                            Cognition Arousal/Alertness: Awake/alert Behavior During Therapy: WFL for tasks assessed/performed Overall Cognitive Status: Within Functional Limits for tasks assessed                                 General Comments: seems more aware of his sequence of movement, able to assist standing and sidestepping to the chair      Exercises Total Joint Exercises Ankle Circles/Pumps: AROM;5 reps Heel Slides: AROM;AAROM;10 reps Hip ABduction/ADduction: AROM;AAROM;10 reps Long Arc Quad: AAROM;Strengthening;10 reps    General Comments General comments (skin integrity, edema, etc.): pt is up to take steps, has demonstrated a better control of standing balance and LLEknee ext.        Pertinent Vitals/Pain Pain Assessment: 0-10 Pain Score: 3  Pain Location: knee pain minimally with standing and bending Pain Descriptors / Indicators: Operative site guarding Pain Intervention(s): Limited activity within  patient's tolerance;Monitored during session;Premedicated before session;Repositioned    Home Living                      Prior Function            PT Goals (current goals can now be found in the care plan section) Acute Rehab PT Goals Patient Stated Goal: to go back home Progress towards PT goals: Progressing toward goals    Frequency     Min 5X/week      PT Plan Current plan remains appropriate    Co-evaluation              AM-PAC PT "6 Clicks" Mobility   Outcome Measure  Help needed turning from your back to your side while in a flat bed without using bedrails?: A Little Help needed moving from lying on your back to sitting on the side of a flat bed without using bedrails?: A Little Help needed moving to and from a bed to a chair (including a wheelchair)?: A Lot Help needed standing up from a chair using your arms (e.g., wheelchair or bedside chair)?: A Lot Help needed to walk in hospital room?: A Little Help needed climbing 3-5 steps with a railing? : Total 6 Click Score: 14    End of Session Equipment Utilized During Treatment: Gait belt Activity Tolerance: Patient tolerated treatment well Patient left: in chair;with call bell/phone within reach;with chair alarm set Nurse Communication: Mobility status PT Visit Diagnosis: Muscle weakness (generalized) (M62.81)     Time: 1010-1036 PT Time Calculation (min) (ACUTE ONLY): 26 min  Charges:  $Gait Training: 8-22 mins $Therapeutic Exercise: 8-22 mins                    Ramond Dial 07/08/2019, 12:48 PM   Mee Hives, PT MS Acute Rehab Dept. Number: [place]

## 2019-07-08 NOTE — Progress Notes (Signed)
Per Physical Therapist:  Patient suffers from OA on L knee, which impairs their ability to perform daily activities like walking short trips in the home.  A walker alone will not resolve the issues with performing activities of daily living. A lightweight wheelchair will allow patient to safely perform daily activities.  The patient can self propel in the home or has a caregiver who can provide assistance.

## 2019-07-09 NOTE — TOC Progression Note (Signed)
Transition of Care [facility]) - Progression Note    Patient Details  Name: MEHKI KLUMPP MRN: [id number] Date of Birth: Jan 21, 1931  Transition of Care [facility]) CM/SW [place], [place] Phone Number: 07/09/2019, 12:41 PM  Clinical Narrative:    Spouse reports the "4 in 1" they have at home is no longer sturdy enough for the patient and has requested a 3 in1. CSW ordered 3 in 1 through [place], will delivered to the patient.    Expected Discharge Plan: [place] Barriers to Discharge: Barriers Resolved  Expected Discharge Plan and Services Expected Discharge Plan: [place]   Discharge Planning Services: CM Consult Post Acute Care Choice: [place] arrangements for the past 2 months: Single Family Home Expected Discharge Date: 07/09/19               DME Arranged: 3-N-1 DME Agency: [employer] Date DME Agency Contacted: 07/09/19 Time DME Agency Contacted: [id number] Representative spoke with at DME Agency: [place]: PT [place]: [facility]         Social Determinants of Health ([place]) Interventions    Readmission Risk Interventions No flowsheet data found.

## 2019-07-09 NOTE — Progress Notes (Signed)
Pt's Wife and pt were notified hours ago regarding the pt being d/c home today via [place]. I spoke with the two of them multiple times about being d/c home today. As I started to review the d/c paperwork with the pt, the pt became upset and indicated that he was unaware of being d/c home today.   Per Kathrin Greathouse, SW, the pt's Wife then called her and indicated she was also unaware of the pt being d/c home today. Wife mentioned they won't have heat until tomorrow and their private care won't be able to come for another 24 hours. PTAR was cancelled d/t lack of heat.   Wife mentioned that Mayer Camel promised to d/c the pt himself and would call the her ahead of time.   [name], PA was notified.

## 2019-07-09 NOTE — Progress Notes (Signed)
PATIENT ID: Gilbert Morse  MRN: [id number]  DOB/AGE:  [birth year] / 85 y.o.  7 Days Post-Op Procedure(s) (LRB): Left Knee Arthroplasty (Left)    PROGRESS NOTE Subjective: Patient is alert, oriented, no Nausea, no Vomiting, yes passing gas. Taking PO well. Denies SOB, Chest or Calf Pain. Using Incentive Spirometer, PAS in place. Ambulate WBAT with pt walking 5 ft with therapy and working on transfers, Patient reports pain as 3/10 .    Objective: Vital signs in last 24 hours: Vitals:   07/08/19 1634 07/08/19 2139 07/09/19 0531 07/09/19 0816  BP: 120/61 135/62 (!) 113/59 99/64  Pulse: 62 62 71 64  Resp:  16 16   Temp:  97.8 F (36.6 C) 98.2 F (36.8 C)   TempSrc:  Oral Oral   SpO2:  97% 97%   Weight:      Height:          Intake/Output from previous day: I/O last 3 completed shifts: In: 720 [P.O.:720] Out: 2630 [Urine:2630]   Intake/Output this shift: Total I/O In: 300 [P.O.:300] Out: -    LABORATORY DATA: Recent Labs    07/07/19 0932  WBC 9.3  HGB 11.6*  HCT 35.4*  PLT 198    Examination: Neurologically intact Neurovascular intact Sensation intact distally Intact pulses distally Dorsiflexion/Plantar flexion intact Incision: dressing C/D/I No cellulitis present Compartment soft}  Assessment:   7 Days Post-Op Procedure(s) (LRB): Left Knee Arthroplasty (Left) ADDITIONAL DIAGNOSIS: Expected Acute Blood Loss Anemia, Hypertension Anticipated LOS equal to or greater than 2 midnights due to - Age 45 and older with one or more of the following:  - Obesity  - Expected need for hospital services (PT, OT, Nursing) required for safe  discharge  - Anticipated need for postoperative skilled nursing care or inpatient rehab  OR   - Unanticipated findings during/Post Surgery: Slow post-op progression: GI, pain control, mobility     Plan: PT/OT WBAT, AROM and PROM  DVT Prophylaxis:  SCDx72hrs, ASA 81 mg BID x 2 weeks DISCHARGE PLAN: Home, when pt passes therapy  goals DISCHARGE NEEDS: HHPT, Walker and 3-in-1 comode seat     [name] 07/09/2019, 9:19 AM

## 2019-07-09 NOTE — Care Management Important Message (Signed)
Important Message  Patient Details IM Letter given to Kathrin Greathouse SW to  Present to the Patient Name: Gilbert Morse MRN: [id number] Date of Birth: 03-25-1931   Medicare Important Message Given:  Yes     Kerin Salen 07/09/2019, 11:55 AM

## 2019-07-09 NOTE — Progress Notes (Signed)
Physical Therapy Treatment Patient Details Name: Gilbert Morse MRN: [id number] DOB: 05/21/1931 Today's Date: 07/09/2019    History of Present Illness Patient is 85 y.o. male s/p Lt TKA on 07/02/19 with PMH significant HTN, history of falls, and OA.    PT Comments    POD #7 am session. Pt was very motivated this morning to get up and try to walk. General bed mobility comments: Pt OOB in recliner and returned to recliner after session General transfer comment: Pt required VC's to push up from the chair and not to pull on the walker. Pt required min assist to stand but once standing he was CGA for safety. General Gait Details: Pt was able to ambulate 15 ft with a RW. Pt required one sitting rest break. Pt did not have any buckling of his knees Pt was given TKA HEP and was show exercises to do. Pt demonstrated understanding of all exercises.    Follow Up Recommendations  SNF;Other (comment)     Equipment Recommendations  Wheelchair (measurements PT)    Recommendations for Other Services       Precautions / Restrictions Precautions Precautions: Fall;Knee Restrictions Weight Bearing Restrictions: No    Mobility  Bed Mobility               General bed mobility comments: Pt OOB in recliner and returned to recliner after session  Transfers Overall transfer level: Needs assistance Equipment used: Rolling walker (2 wheeled) Transfers: Sit to/from Stand Sit to Stand: Min assist         General transfer comment: Pt required VC's to push up from the chair and not to pull on the walker. Pt required min assist to stand but once standing he was CGA for safety  Ambulation/Gait Ambulation/Gait assistance: Min assist Gait Distance (Feet): 15 Feet Assistive device: Rolling walker (2 wheeled) Gait Pattern/deviations: Step-to pattern;Shuffle;Decreased stride length;Trunk flexed     General Gait Details: Pt was able to ambulate 15 ft with a RW. Pt required one sitting rest break.  Pt did not have any buckling of his knees   Stairs             Wheelchair Mobility    Modified Rankin (Stroke Patients Only)       Balance                                            Cognition Arousal/Alertness: Awake/alert Behavior During Therapy: WFL for tasks assessed/performed Overall Cognitive Status: Within Functional Limits for tasks assessed                                        Exercises Total Joint Exercises Ankle Circles/Pumps: AROM;5 reps;10 reps;Seated Quad Sets: AROM;Left;10 reps;Seated Short Arc Quad: AAROM;Left;10 reps;Seated Heel Slides: AROM;AAROM;10 reps;Seated;Left Hip ABduction/ADduction: AROM;AAROM;10 reps;Seated;Left Straight Leg Raises: AAROM;Left;10 reps;Seated Long Arc Quad: AAROM;5 reps;Left;Seated    General Comments        Pertinent Vitals/Pain Pain Assessment: 0-10 Pain Score: 5  Pain Descriptors / Indicators: Aching;Sore;Grimacing;Discomfort Pain Intervention(s): Monitored during session;Repositioned;Ice applied    Home Living                      Prior Function            PT Goals (current  goals can now be found in the care plan section) Progress towards PT goals: Progressing toward goals    Frequency    Min 5X/week      PT Plan Current plan remains appropriate    Co-evaluation              AM-PAC PT "6 Clicks" Mobility   Outcome Measure  Help needed turning from your back to your side while in a flat bed without using bedrails?: A Little Help needed moving from lying on your back to sitting on the side of a flat bed without using bedrails?: A Little Help needed moving to and from a bed to a chair (including a wheelchair)?: A Little Help needed standing up from a chair using your arms (e.g., wheelchair or bedside chair)?: A Lot Help needed to walk in hospital room?: A Little Help needed climbing 3-5 steps with a railing? : Total 6 Click Score: 15    End of  Session Equipment Utilized During Treatment: Gait belt Activity Tolerance: Patient tolerated treatment well Patient left: in chair;with call bell/phone within reach;with chair alarm set Nurse Communication: Mobility status PT Visit Diagnosis: Muscle weakness (generalized) (M62.81)     Time: 4970-2637 PT Time Calculation (min) (ACUTE ONLY): 28 min  Charges:  $Gait Training: 8-22 mins $Therapeutic Exercise: 8-22 mins                     Marti Sleigh, SPTA [place] [facility]

## 2019-07-10 NOTE — Progress Notes (Signed)
Patient discharged to home via PTAR. Wife present during transfer. All belongings w/ wife.

## 2019-07-10 NOTE — Care Plan (Signed)
Lengthy discussion with wife this am. She verbally understands that patient will be discharged to home today via ambulance. [facility] will [facility]. May add HHOT if needed. Will follow as an outpatient.   Ladell Heads, [place]

## 2019-07-10 NOTE — Progress Notes (Signed)
PATIENT ID: Gilbert Morse  MRN: [id number]  DOB/AGE:  Jul 23, 1931 / 85 y.o.  8 Days Post-Op Procedure(s) (LRB): Left Knee Arthroplasty (Left)    PROGRESS NOTE Subjective: Patient is alert, oriented, no Nausea, no Vomiting, yes passing gas. Taking PO well. Denies SOB, Chest or Calf Pain. Using Incentive Spirometer, PAS in place. Ambulate 15', Patient reports pain as 3/10.    Objective: Vital signs in last 24 hours: Vitals:   07/09/19 0816 07/09/19 1419 07/09/19 2044 07/10/19 0534  BP: 99/64 136/67 108/60 127/75  Pulse: 64 78 64 77  Resp:   18 16  Temp:  97.8 F (36.6 C) 97.8 F (36.6 C) 97.7 F (36.5 C)  TempSrc:  Oral    SpO2:  95% 94% 97%  Weight:      Height:          Intake/Output from previous day: I/O last 3 completed shifts: In: 900 [P.O.:900] Out: 4166 [Urine:3455]   Intake/Output this shift: No intake/output data recorded.   LABORATORY DATA: Recent Labs    07/07/19 0932  WBC 9.3  HGB 11.6*  HCT 35.4*  PLT 198    Examination: Neurologically intact ABD soft Neurovascular intact Sensation intact distally Intact pulses distally Dorsiflexion/Plantar flexion intact Incision: dressing C/D/I No cellulitis present Compartment soft}  Assessment:   8 Days Post-Op Procedure(s) (LRB): Left Knee Arthroplasty (Left) ADDITIONAL DIAGNOSIS: Expected Acute Blood Loss Anemia, Hypertension, BPH Anticipated LOS equal to or greater than 2 midnights due to - Age 83 and older with one or more of the following:  - Obesity  - Expected need for hospital services (PT, OT, Nursing) required for safe discharge     Plan: PT/OT WBAT, AROM and PROM  DVT Prophylaxis:  SCDx72hrs, ASA 81 mg BID x 2 weeks DISCHARGE PLAN: Home DISCHARGE NEEDS: Walker and 3-in-1 comode seat  Called patient's wife this morning at 0 700 do clear up confusion about possible discharge.  She stated that the home heater was out but she thought it would be getting fixed today.She also reported that her  son may be able to help her in getting her husband settled in today.  I also made his family aware that resources at the hospital were being stretched because of the pain endemic, and if we were unable to get him home today we would have to make plans for him to go to a skilled nursing facility, which of course the family would like to avoid if all possible.Patient's wife anticipates is discharged to home today via ambulance.    Kerin Salen 07/10/2019, 7:07 AM Patient ID: Gilbert Morse, male   DOB: 1931-06-13, 85 y.o.   MRN: [id number]

## 2019-07-10 NOTE — Progress Notes (Signed)
Physical Therapy Treatment Patient Details Name: Gilbert Morse MRN: [id number] DOB: 1930-08-17 Today's Date: 07/10/2019    History of Present Illness Patient is 85 y.o. male s/p Lt TKA on 07/02/19 with PMH significant HTN, history of falls, and OA.    PT Comments    POD # 8 General transfer comment: required increased assist due to increased c/o fatigue after walking to and from bathroom with nursing staff for a BM. General Gait Details: pt was unable to amb due to increased pain and fatigue after walking to and from bathroom with nursing staff for a BM.  Then  performed some TE's following HEP handout.  Instructed on proper tech, freq as well as use of ICE.     Follow Up Recommendations  SNF;Other (comment)(family declines SNF)     Equipment Recommendations       Recommendations for Other Services       Precautions / Restrictions Precautions Precautions: Fall;Knee Precaution Comments: "other knee" is bad and pt wears a brace Restrictions Weight Bearing Restrictions: No Other Position/Activity Restrictions: WBAT    Mobility  Bed Mobility               General bed mobility comments: Pt OOB in recliner  Transfers Overall transfer level: Needs assistance   Transfers: Sit to/from Stand Sit to Stand: Mod assist Stand pivot transfers: From elevated surface;Mod assist       General transfer comment: required increased assist due to increased c/o fatigue after walking to and from bathroom with nursing staff for a BM.  Ambulation/Gait             General Gait Details: pt was unable to amb due to increased pain and fatigue after walking to and from bathroom with nursing staff for a BM   Stairs             Wheelchair Mobility    Modified Rankin (Stroke Patients Only)       Balance                                            Cognition Arousal/Alertness: Awake/alert Behavior During Therapy: WFL for tasks  assessed/performed Overall Cognitive Status: Within Functional Limits for tasks assessed                                 General Comments: Max c/o fatigue      Exercises   Total Knee Replacement TE's 10 reps B LE ankle pumps 10 reps towel squeezes 10 reps knee presses 10 reps heel slides  10 reps SAQ's 10 reps SLR's 10 reps ABD Followed by ICE     General Comments        Pertinent Vitals/Pain Pain Assessment: 0-10 Pain Score: 7  Pain Location: L knee Pain Descriptors / Indicators: Aching;Sore;Grimacing;Discomfort Pain Intervention(s): Patient requesting pain meds-RN notified;Monitored during session;Repositioned;Ice applied    Home Living                      Prior Function            PT Goals (current goals can now be found in the care plan section) Progress towards PT goals: Progressing toward goals    Frequency    Min 5X/week      PT Plan Current plan remains  appropriate    Co-evaluation              AM-PAC PT "6 Clicks" Mobility   Outcome Measure  Help needed turning from your back to your side while in a flat bed without using bedrails?: A Lot Help needed moving from lying on your back to sitting on the side of a flat bed without using bedrails?: A Lot Help needed moving to and from a bed to a chair (including a wheelchair)?: A Lot Help needed standing up from a chair using your arms (e.g., wheelchair or bedside chair)?: A Lot Help needed to walk in hospital room?: A Lot Help needed climbing 3-5 steps with a railing? : A Lot 6 Click Score: 12    End of Session Equipment Utilized During Treatment: Gait belt Activity Tolerance: Patient limited by fatigue Patient left: in chair;with call bell/phone within reach;with chair alarm set Nurse Communication: Mobility status PT Visit Diagnosis: Muscle weakness (generalized) (M62.81)     Time: 3086-5784 PT Time Calculation (min) (ACUTE ONLY): 20 min  Charges:   $Therapeutic Exercise: 8-22 mins                     Felecia Shelling  PTA Acute  Rehabilitation Services Pager      [phone] Office      [phone]

## 2019-11-15 ENCOUNTER — Ambulatory Visit: Payer: Medicare Other | Attending: Internal Medicine

## 2019-11-15 DIAGNOSIS — Z23 Encounter for immunization: Secondary | ICD-10-CM

## 2019-11-15 NOTE — Progress Notes (Signed)
   Covid-19 Vaccination Clinic  Name:  EARMON SHERROW    MRN: [id number] DOB: 1930/12/31  11/15/2019  Mr. Gubser was observed post Covid-19 immunization for 15 minutes without incident. He was provided with Vaccine Information Sheet and instruction to access the V-Safe system.   Mr. Stanislaw was instructed to call 911 with any severe reactions post vaccine: Marland Kitchen Difficulty breathing  . Swelling of face and throat  . A fast heartbeat  . A bad rash all over body  . Dizziness and weakness   Immunizations Administered    Name Date Dose VIS Date Route   Pfizer COVID-19 Vaccine 11/15/2019  1:55 PM 0.3 mL 07/27/2019 Intramuscular   Manufacturer: [employer], [employer]   Lot: [id number]   NDC: [address]-0

## 2019-12-10 ENCOUNTER — Ambulatory Visit: Payer: Medicare Other | Attending: Internal Medicine

## 2019-12-10 DIAGNOSIS — Z23 Encounter for immunization: Secondary | ICD-10-CM

## 2019-12-10 NOTE — Progress Notes (Signed)
   Covid-19 Vaccination Clinic  Name:  JANARD CULP    MRN: [id number] DOB: [birth year]  12/10/2019  Mr. Fussell was observed post Covid-19 immunization for 15 minutes without incident. He was provided with Vaccine Information Sheet and instruction to access the V-Safe system.   Mr. Gatt was instructed to call 911 with any severe reactions post vaccine: Marland Kitchen Difficulty breathing  . Swelling of face and throat  . A fast heartbeat  . A bad rash all over body  . Dizziness and weakness   Immunizations Administered    Name Date Dose VIS Date Route   Pfizer COVID-19 Vaccine 12/10/2019 12:42 PM 0.3 mL 10/10/2018 Intramuscular   Manufacturer: [employer], [employer]   Lot: [id number]   NDC: [address]-9

## 2023-05-05 ENCOUNTER — Encounter: Payer: Self-pay | Admitting: Physician Assistant

## 2023-05-19 ENCOUNTER — Encounter: Payer: Self-pay | Admitting: Podiatry

## 2023-05-19 ENCOUNTER — Ambulatory Visit: Payer: Medicare Other | Admitting: Podiatry

## 2023-05-19 ENCOUNTER — Ambulatory Visit (INDEPENDENT_AMBULATORY_CARE_PROVIDER_SITE_OTHER): Payer: Medicare Other

## 2023-05-19 DIAGNOSIS — M7672 Peroneal tendinitis, left leg: Secondary | ICD-10-CM | POA: Diagnosis not present

## 2023-05-19 DIAGNOSIS — M79675 Pain in left toe(s): Secondary | ICD-10-CM | POA: Diagnosis not present

## 2023-05-19 DIAGNOSIS — M79674 Pain in right toe(s): Secondary | ICD-10-CM | POA: Diagnosis not present

## 2023-05-19 DIAGNOSIS — B351 Tinea unguium: Secondary | ICD-10-CM | POA: Diagnosis not present

## 2023-05-19 DIAGNOSIS — M778 Other enthesopathies, not elsewhere classified: Secondary | ICD-10-CM

## 2023-05-19 MED ORDER — TRIAMCINOLONE ACETONIDE 10 MG/ML IJ SUSP
10.0000 mg | Freq: Once | INTRAMUSCULAR | Status: AC
  Administered 2023-05-19: 10 mg via INTRA_ARTICULAR

## 2023-05-20 NOTE — Progress Notes (Signed)
Subjective:   Patient ID: Gilbert Morse, male   DOB: 89 y.o.   MRN: [id number]   HPI Patient presents with caregiver with 2 problems with 1 being a lot of pain in the outside of the left foot and secondarily elongated thickened toenails that they cannot cut   Review of Systems  All other systems reviewed and are negative.       Objective:  Physical Exam Vitals and nursing note reviewed.  Constitutional:      Appearance: He is well-developed.  Pulmonary:     Effort: Pulmonary effort is normal.  Musculoskeletal:        General: Normal range of motion.  Skin:    General: Skin is warm.  Neurological:     Mental Status: He is alert.     Neurovascular status was found to be intact muscle strength was found to be adequate range of motion adequate with inflammation pain around the base of the fifth metatarsal left fluid buildup around the area with thick yellow brittle nailbeds 1-5 both feet that are painful     Assessment:  Probability for peroneal tendinitis left along with mycotic nail infection 1-5 both feet     Plan:  H&P reviewed both conditions patient caregiver sterile prep injected the peroneal insertion left 3 mg dexamethasone Kenalog 5 mg Xylocaine after explaining risk and debrided painful nailbeds 1-5 both feet no iatrogenic bleeding reappoint routine care

## 2023-05-24 ENCOUNTER — Telehealth: Payer: Self-pay | Admitting: Physician Assistant

## 2023-05-24 NOTE — Telephone Encounter (Signed)
Patients daughter has some questions concerning the new patient appointment and would like a call back

## 2023-05-29 ENCOUNTER — Emergency Department (HOSPITAL_COMMUNITY)
Admission: EM | Admit: 2023-05-29 | Discharge: 2023-05-30 | Disposition: A | Discharge status: Home / Self Care | Payer: Medicare Other | Attending: Emergency Medicine | Admitting: Emergency Medicine

## 2023-05-29 ENCOUNTER — Other Ambulatory Visit: Payer: Self-pay

## 2023-05-29 ENCOUNTER — Emergency Department (HOSPITAL_COMMUNITY): Payer: Medicare Other

## 2023-05-29 DIAGNOSIS — Y92002 Bathroom of unspecified non-institutional (private) residence single-family (private) house as the place of occurrence of the external cause: Secondary | ICD-10-CM | POA: Diagnosis not present

## 2023-05-29 DIAGNOSIS — Z7982 Long term (current) use of aspirin: Secondary | ICD-10-CM | POA: Insufficient documentation

## 2023-05-29 DIAGNOSIS — S0101XA Laceration without foreign body of scalp, initial encounter: Secondary | ICD-10-CM | POA: Diagnosis not present

## 2023-05-29 DIAGNOSIS — Z23 Encounter for immunization: Secondary | ICD-10-CM | POA: Insufficient documentation

## 2023-05-29 DIAGNOSIS — W19XXXA Unspecified fall, initial encounter: Secondary | ICD-10-CM | POA: Insufficient documentation

## 2023-05-29 DIAGNOSIS — S0990XA Unspecified injury of head, initial encounter: Secondary | ICD-10-CM | POA: Diagnosis present

## 2023-05-29 MED ORDER — TETANUS-DIPHTH-ACELL PERTUSSIS 5-2.5-18.5 LF-MCG/0.5 IM SUSY
0.5000 mL | PREFILLED_SYRINGE | Freq: Once | INTRAMUSCULAR | Status: AC
  Administered 2023-05-29: 0.5 mL via INTRAMUSCULAR
  Filled 2023-05-29: qty 0.5

## 2023-05-29 MED ORDER — LIDOCAINE-EPINEPHRINE (PF) 2 %-1:200000 IJ SOLN
10.0000 mL | Freq: Once | INTRAMUSCULAR | Status: AC
  Administered 2023-05-29: 10 mL via INTRADERMAL
  Filled 2023-05-29: qty 20

## 2023-05-29 NOTE — Discharge Instructions (Signed)
You are seen in the emergency room today for a mechanical fall.  Your imaging of your CT head and CT cervical spine were thankfully reassuring.  You did sustain a laceration which was repaired here with staples.  These need to remain in place for the next 10 to 14 days.  These can be removed by her primary care provider or back here in the emergency department.  If any signs of infection develop, please seek immediate medical care.

## 2023-05-29 NOTE — ED Provider Notes (Signed)
[facility] AT [facility] Provider Note   CSN: [id number] Arrival date & time: 05/29/23  2035     History Chief Complaint  Patient presents with   Gilbert Morse is a 89 y.o. male.  Patient past history significant for degenerative arthritis presents to the emergency department concerns of a fall.  Reports that he sustained an unwitnessed fall while he was in the bathroom as he lost his balance and hit the doorknob striking the posterior aspect of his head.  Not currently on blood thinners.  Family does not report that he had any loss of consciousness but notable bleeding was present.  No prior history of any severe head trauma causing head bleed and not currently Dors any pain in the area besides the area that he struck his head with the laceration present.  Denies any bony injury in any extremity or in his torso or abdomen.  Mentating at what appears to be baseline although patient is demented at baseline.  No slurred speech, facial droop, unilateral weakness or numbness.   Fall       Home Medications Prior to Admission medications   Medication Sig Start Date End Date Taking? Authorizing Provider  aspirin EC 81 MG tablet Take 1 tablet (81 mg total) by mouth 2 (two) times daily. 07/02/19   Allena Katz, PA-C  carvedilol (COREG) 25 MG tablet Take 25 mg by mouth 2 (two) times daily. 05/22/19   [provider]  diltiazem (CARDIZEM CD) 300 MG 24 hr capsule Take 300 mg by mouth daily. 01/12/16   [provider]  finasteride (PROSCAR) 5 MG tablet Take 5 mg by mouth at bedtime. 05/22/19   [provider]  hydrochlorothiazide (HYDRODIURIL) 25 MG tablet Take 25 mg by mouth daily. 05/22/19   [provider]  lisinopril (ZESTRIL) 40 MG tablet Take 40 mg by mouth daily. 02/07/16   [provider]  Multiple Vitamins-Minerals (IMMUNE SUPPORT VITAMIN C PO) Take 1 tablet by mouth daily. L'il critter    [provider]  oxyCODONE-acetaminophen (PERCOCET/ROXICET) 5-325 MG tablet Take 1 tablet by mouth every 4 (four) hours as needed for severe pain. 07/02/19   Allena Katz, PA-C  spironolactone (ALDACTONE) 25 MG tablet Take 25 mg by mouth daily. 05/22/19   [provider]  tamsulosin (FLOMAX) 0.4 MG CAPS capsule Take 0.4 mg by mouth at bedtime. 05/22/19   [provider]  tiZANidine (ZANAFLEX) 2 MG tablet Take 1 tablet (2 mg total) by mouth every 6 (six) hours as needed. 07/02/19   Allena Katz, PA-C      Allergies    Patient has no known allergies.    Review of Systems   Review of Systems  Skin:  Positive for wound.  All other systems reviewed and are negative.   Physical Exam Updated Vital Signs BP (!) 143/60   Pulse 66   Temp 98 F (36.7 C)   Resp 20   SpO2 99%  Physical Exam Vitals and nursing note reviewed.  Constitutional:      General: He is not in acute distress.    Appearance: He is well-developed.  HENT:     Head: Normocephalic and atraumatic.  Eyes:     Extraocular Movements: Extraocular movements intact.     Conjunctiva/sclera: Conjunctivae normal.     Pupils: Pupils are equal, round, and reactive to light.  Cardiovascular:     Rate and Rhythm: Normal rate and  regular rhythm.     Heart sounds: No murmur heard. Pulmonary:     Effort: Pulmonary effort is normal. No respiratory distress.     Breath sounds: Normal breath sounds.  Abdominal:     Palpations: Abdomen is soft.     Tenderness: There is no abdominal tenderness.  Musculoskeletal:        General: No swelling, tenderness or signs of injury.     Cervical back: Neck supple.  Skin:    General: Skin is warm and dry.     Capillary Refill: Capillary refill takes less than 2 seconds.     Findings: Lesion present.     Comments: 5 cm laceration noted to the occipital scalp with actively bleeding.  No other skin injury.  Neurological:     Mental Status: He is alert.  Psychiatric:         Mood and Affect: Mood normal.     ED Results / Procedures / Treatments   Labs (all labs ordered are listed, but only abnormal results are displayed) Labs Reviewed - No data to display  EKG EKG Interpretation Date/Time:  Sunday May 29 2023 20:47:31 EDT Ventricular Rate:  169 PR Interval:    QRS Duration:  164 QT Interval:  299 QTC Calculation: 455 R Axis:   -84  Text Interpretation: Artifact in lead(s) I II III aVR aVL aVF V1 V2 V5 V6 Interpretation limited secondary to artifact Confirmed by Zadie Rhine ([id number]) on 05/29/2023 11:26:26 PM  Radiology CT Cervical Spine Wo Contrast  Result Date: 05/29/2023 CLINICAL DATA:  Neck trauma. EXAM: CT CERVICAL SPINE WITHOUT CONTRAST TECHNIQUE: Multidetector CT imaging of the cervical spine was performed without intravenous contrast. Multiplanar CT image reconstructions were also generated. RADIATION DOSE REDUCTION: This exam was performed according to the departmental dose-optimization program which includes automated exposure control, adjustment of the mA and/or kV according to patient size and/or use of iterative reconstruction technique. COMPARISON:  None Available. FINDINGS: Alignment: No acute subluxation. Straightening of normal cervical lordosis which may be positional or due to muscle spasm. Skull base and vertebrae: No acute fracture.  Osteopenia. Soft tissues and spinal canal: No prevertebral fluid or swelling. No visible canal hematoma. Disc levels:  No acute findings.  Degenerative changes. Upper chest: Negative. Other: Bilateral carotid bulb calcified plaques. IMPRESSION: No acute/traumatic cervical spine pathology. Extensive degenerative changes. Electronically Signed   By: Elgie Collard M.D.   On: 05/29/2023 22:46    Procedures .Marland KitchenLaceration Repair  Date/Time: 05/29/2023 11:54 PM  Performed by: Smitty Knudsen, PA-C Authorized by: Smitty Knudsen, PA-C   Consent:    Consent obtained:  Verbal   Consent given by:   Patient   Risks discussed:  Infection, pain and poor cosmetic result   Alternatives discussed:  No treatment Universal protocol:    Patient identity confirmed:  Verbally with patient and arm band Anesthesia:    Anesthesia method:  Local infiltration Laceration details:    Location:  Scalp   Scalp location:  Crown   Length (cm):  10   Depth (mm):  3 Pre-procedure details:    Preparation:  Imaging obtained to evaluate for foreign bodies Exploration:    Hemostasis achieved with:  Direct pressure and epinephrine   Imaging outcome: foreign body not noted     Contaminated: no   Treatment:    Area cleansed with:  Povidone-iodine and saline   Amount of cleaning:  Standard   Irrigation solution:  Sterile saline   Irrigation volume:  200cc  Irrigation method:  Syringe Skin repair:    Repair method:  Staples   Number of staples:  9 Approximation:    Approximation:  Close Repair type:    Repair type:  Intermediate Post-procedure details:    Dressing:  Open (no dressing)   Procedure completion:  Tolerated    Medications Ordered in ED Medications  lidocaine-EPINEPHrine (XYLOCAINE W/EPI) 2 %-1:200000 (PF) injection 10 mL (10 mLs Intradermal Given 05/29/23 2340)  Tdap (BOOSTRIX) injection 0.5 mL (0.5 mLs Intramuscular Given 05/29/23 2350)    ED Course/ Medical Decision Making/ A&P Clinical Course as of 05/29/23 2358  Sun May 29, 2023  2250 CT Cervical Spine Wo Contrast [OZ]    Clinical Course User Index [OZ] Smitty Knudsen, PA-C                               Medical Decision Making Amount and/or Complexity of Data Reviewed Radiology: ordered. Decision-making details documented in ED Course.  Risk Prescription drug management.   This patient presents to the ED for concern of fall.  Differential diagnosis includes cyclical loss of consciousness, head laceration, SAH, sepsis   Imaging Studies ordered:  I ordered imaging studies including CT head, CT cervical spine I  independently visualized and interpreted imaging which showed no acute intracranial normality or cervical spine injury I agree with the radiologist interpretation   Medicines ordered and prescription drug management:  I ordered medication including lidocaine, Tdap for anesthesia, infection prevention Reevaluation of the patient after these medicines showed that the patient improved I have reviewed the patients home medicines and have made adjustments as needed   Problem List / ED Course:  Patient presents to the emergency department with past medical history significant for degenerative arthritis, dementia.  Patient is seen here for concerns of mechanical fall in which she slipped and hit his head against a doorknob in the bathroom.  Denies feeling weak, dizzy, or faint prior to this fall.  Currently denies any headache, vision changes, or generalized fatigue.  Appears a fall was largely mechanical in nature.  CT head and cervical spine ordered for evaluation of patient's injuries.  A 5 inch laceration is noted to the crown/occipital region of patient's scalp.  Minimal bleeding at this time.  Tdap not on chart so ensure patient is up-to-date and patient is a poor historian to be able to account for this. CT imaging of cervical spine and head are reassuring without any acute injuries noted.  Will plan on staple closure of the wound site.  Patient is in agreement with this plan.  Tdap booster administered. After administration of lidocaine with epinephrine into the scalp, patient tolerated staple repair without any significant difficulty.  Advised that the staples need to remain in place for the next 10 to 14 days depending on wound healing.  Will provide patient with discharge instructions as concern for poor historian and will instruct for wound management as well as evaluation of any signs of infection.  Patient does not have any other acute concerns at this time and is otherwise hemodynamically  stable.  Will plan on discharging home. Discharged home in stable condition.  Final Clinical Impression(s) / ED Diagnoses Final diagnoses:  Fall, initial encounter  Laceration of scalp, initial encounter    Rx / DC Orders ED Discharge Orders     None         Smitty Knudsen, PA-C 05/29/23 2358  Glyn Ade, MD 05/30/23 [phone]

## 2023-05-29 NOTE — ED Triage Notes (Addendum)
Pt bibg[facility] from home. Patient had unwitnessed fall in bathroom. Pt able to recall fall. Pt has dementia at baseline. He stated he lost balance and hit his head on the doorknob, pt has dried blood on head and laceration. No loc, pt NOT on blood thinners. 130/70 Hr 75 96% ra  130 cbg

## 2023-05-30 NOTE — ED Notes (Signed)
[facility] was contacted to verify ETA for patient.  [facility] rep relayed that transport should be there momentarily.

## 2023-05-30 NOTE — ED Notes (Signed)
Still awaiting [facility] to transport home.

## 2023-06-02 ENCOUNTER — Ambulatory Visit (INDEPENDENT_AMBULATORY_CARE_PROVIDER_SITE_OTHER): Payer: Medicare Other | Admitting: Physician Assistant

## 2023-06-02 ENCOUNTER — Encounter: Payer: Self-pay | Admitting: Physician Assistant

## 2023-06-02 ENCOUNTER — Ambulatory Visit: Payer: Medicare Other

## 2023-06-02 VITALS — BP 108/65 | HR 90 | Resp 18 | Ht 74.0 in | Wt 183.0 lb

## 2023-06-02 DIAGNOSIS — G309 Alzheimer's disease, unspecified: Secondary | ICD-10-CM

## 2023-06-02 DIAGNOSIS — F0280 Dementia in other diseases classified elsewhere without behavioral disturbance: Secondary | ICD-10-CM

## 2023-06-02 NOTE — Patient Instructions (Signed)
It was a pleasure to see you today at our office.   Recommendations:   Recommend visiting the website : " Dementia Success Path" to better understand some behaviors related to memory loss. s https://www.[facility].org/ For guidance regarding [facility] and if placement were needed at the facility, contact Sidney Ace, Social Worker tel: [phone]  For assessment of decision of mental capacity and competency:  Call Dr. Erick Blinks, geriatric psychiatrist at [phone]   Whom to call: Memory  decline, memory medications: Call our office [phone]  For psychiatric meds, mood meds: Please have your primary care physician manage these medications.  If you have any severe symptoms of a stroke, or other severe issues such as confusion,severe chills or fever, etc call 911 or go to the ER as you may need to be evaluated further   [email or web address]   RECOMMENDATIONS FOR ALL PATIENTS WITH MEMORY PROBLEMS: 1. Continue to exercise (Recommend 30 minutes of walking everyday, or 3 hours every week) 2. Increase social interactions - continue going to [place] and enjoy social gatherings with friends and family 3. Eat healthy, avoid fried foods and eat more fruits and vegetables 4. Maintain adequate blood pressure, blood sugar, and blood cholesterol level. Reducing the risk of stroke and cardiovascular disease also helps promoting better memory. 5. Avoid stressful situations. Live a simple life and avoid aggravations. Organize your time and prepare for the next day in anticipation. 6. Sleep well, avoid any interruptions of sleep and avoid any distractions in the bedroom that may interfere with adequate sleep quality 7. Avoid sugar, avoid sweets as there is a strong link between excessive sugar intake, diabetes, and cognitive impairment We discussed the Mediterranean diet, which has been shown to help patients reduce the  risk of progressive memory disorders and reduces cardiovascular risk. This includes eating fish, eat fruits and green leafy vegetables, nuts like almonds and hazelnuts, walnuts, and also use olive oil. Avoid fast foods and fried foods as much as possible. Avoid sweets and sugar as sugar use has been linked to worsening of memory function.  There is always a concern of gradual progression of memory problems. If this is the case, then we may need to adjust level of care according to patient needs. Support, both to the patient and caregiver, should then be put into place.    The Alzheimer's Association is here all day, every day for people facing Alzheimer's disease through our free 24/7 Helpline: 902-168-4165. The Helpline provides reliable information and support to all those who need assistance, such as individuals living with memory loss, Alzheimer's or other dementia, caregivers, health care professionals and the public.  Our highly trained and knowledgeable staff can help you with: Understanding memory loss, dementia and Alzheimer's  Medications and other treatment options  General information about aging and brain health  Skills to provide quality care and to find the best care from professionals  Legal, financial and living-arrangement decisions Our Helpline also features: Confidential care consultation provided by master's level clinicians who can help with decision-making support, crisis assistance and education on issues families face every day  Help in a caller's preferred language using our translation service that features more than 200 languages and dialects  Referrals to local community programs, services and ongoing support     FALL PRECAUTIONS: Be cautious when walking. Scan the area for obstacles that may increase the risk of trips and falls. When getting up in the mornings, sit up at the edge of the bed for  a few minutes before getting out of bed. Consider elevating the bed at  the head end to avoid drop of blood pressure when getting up. Walk always in a well-lit room (use night lights in the walls). Avoid area rugs or power cords from appliances in the middle of the walkways. Use a walker or a cane if necessary and consider physical therapy for balance exercise. Get your eyesight checked regularly.  FINANCIAL OVERSIGHT: Supervision, especially oversight when making financial decisions or transactions is also recommended.  HOME SAFETY: Consider the safety of the kitchen when operating appliances like stoves, microwave oven, and blender. Consider having supervision and share cooking responsibilities until no longer able to participate in those. Accidents with firearms and other hazards in the house should be identified and addressed as well.   ABILITY TO BE LEFT ALONE: If patient is unable to contact 911 operator, consider using LifeLine, or when the need is there, arrange for someone to stay with patients. Smoking is a fire hazard, consider supervision or cessation. Risk of wandering should be assessed by caregiver and if detected at any point, supervision and safe proof recommendations should be instituted.  MEDICATION SUPERVISION: Inability to self-administer medication needs to be constantly addressed. Implement a mechanism to ensure safe administration of the medications.   DRIVING: Regarding driving, in patients with progressive memory problems, driving will be impaired. We advise to have someone else do the driving if trouble finding directions or if minor accidents are reported. Independent driving assessment is available to determine safety of driving.   If you are interested in the driving assessment, you can contact the following:  [employer] in [place] [phone]   [facility] [phone] or [phone]      Mediterranean Diet A Mediterranean diet refers to food and lifestyle choices that  are based on the traditions of countries located on the [employer]. This way of eating has been shown to help prevent certain conditions and improve outcomes for people who have chronic diseases, like kidney disease and heart disease. What are tips for following this plan? Lifestyle  Cook and eat meals together with your family, when possible. Drink enough fluid to keep your urine clear or pale yellow. Be physically active every day. This includes: Aerobic exercise like running or swimming. Leisure activities like gardening, walking, or housework. Get 7-8 hours of sleep each night. If recommended by your health care provider, drink red wine in moderation. This means 1 glass a day for nonpregnant women and 2 glasses a day for men. A glass of wine equals 5 oz (150 mL). Reading food labels  Check the serving size of packaged foods. For foods such as rice and pasta, the serving size refers to the amount of cooked product, not dry. Check the total fat in packaged foods. Avoid foods that have saturated fat or trans fats. Check the ingredients list for added sugars, such as corn syrup. Shopping  At the grocery store, buy most of your food from the areas near the walls of the store. This includes: Fresh fruits and vegetables (produce). Grains, beans, nuts, and seeds. Some of these may be available in unpackaged forms or large amounts (in bulk). Fresh seafood. Poultry and eggs. Low-fat dairy products. Buy whole ingredients instead of prepackaged foods. Buy fresh fruits and vegetables in-season from local farmers markets. Buy frozen fruits and vegetables in resealable bags. If you do not have access to quality fresh seafood, buy precooked frozen shrimp  or canned fish, such as tuna, salmon, or sardines. Buy small amounts of raw or cooked vegetables, salads, or olives from the deli or salad bar at your store. Stock your pantry so you always have certain foods on hand, such as olive oil, canned  tuna, canned tomatoes, rice, pasta, and beans. Cooking  Cook foods with extra-virgin olive oil instead of using butter or other vegetable oils. Have meat as a side dish, and have vegetables or grains as your main dish. This means having meat in small portions or adding small amounts of meat to foods like pasta or stew. Use beans or vegetables instead of meat in common dishes like chili or lasagna. Experiment with different cooking methods. Try roasting or broiling vegetables instead of steaming or sauteing them. Add frozen vegetables to soups, stews, pasta, or rice. Add nuts or seeds for added healthy fat at each meal. You can add these to yogurt, salads, or vegetable dishes. Marinate fish or vegetables using olive oil, lemon juice, garlic, and fresh herbs. Meal planning  Plan to eat 1 vegetarian meal one day each week. Try to work up to 2 vegetarian meals, if possible. Eat seafood 2 or more times a week. Have healthy snacks readily available, such as: Vegetable sticks with hummus. Greek yogurt. Fruit and nut trail mix. Eat balanced meals throughout the week. This includes: Fruit: 2-3 servings a day Vegetables: 4-5 servings a day Low-fat dairy: 2 servings a day Fish, poultry, or lean meat: 1 serving a day Beans and legumes: 2 or more servings a week Nuts and seeds: 1-2 servings a day Whole grains: 6-8 servings a day Extra-virgin olive oil: 3-4 servings a day Limit red meat and sweets to only a few servings a month What are my food choices? Mediterranean diet Recommended Grains: Whole-grain pasta. Brown rice. Bulgar wheat. Polenta. Couscous. Whole-wheat bread. Orpah Cobb. Vegetables: Artichokes. Beets. Broccoli. Cabbage. Carrots. Eggplant. Green beans. Chard. Kale. Spinach. Onions. Leeks. Peas. Squash. Tomatoes. Peppers. Radishes. Fruits: Apples. Apricots. Avocado. Berries. Bananas. Cherries. Dates. Figs. Grapes. Lemons. Melon. Oranges. Peaches. Plums. Pomegranate. Meats and  other protein foods: Beans. Almonds. Sunflower seeds. Pine nuts. Peanuts. Cod. Salmon. Scallops. Shrimp. Tuna. Tilapia. Clams. Oysters. Eggs. Dairy: Low-fat milk. Cheese. Greek yogurt. Beverages: Water. Red wine. Herbal tea. Fats and oils: Extra virgin olive oil. Avocado oil. Grape seed oil. Sweets and desserts: [place] yogurt with honey. Baked apples. Poached pears. Trail mix. Seasoning and other foods: Basil. Cilantro. Coriander. Cumin. Mint. Parsley. Sage. Rosemary. Tarragon. Garlic. Oregano. Thyme. Pepper. Balsalmic vinegar. Tahini. Hummus. Tomato sauce. Olives. Mushrooms. Limit these Grains: Prepackaged pasta or rice dishes. Prepackaged cereal with added sugar. Vegetables: Deep fried potatoes (french fries). Fruits: Fruit canned in syrup. Meats and other protein foods: Beef. Pork. Lamb. Poultry with skin. Hot dogs. Tomasa Blase. Dairy: Ice cream. Sour cream. Whole milk. Beverages: Juice. Sugar-sweetened soft drinks. Beer. Liquor and spirits. Fats and oils: Butter. Canola oil. Vegetable oil. Beef fat (tallow). Lard. Sweets and desserts: Cookies. Cakes. Pies. Candy. Seasoning and other foods: Mayonnaise. Premade sauces and marinades. The items listed may not be a complete list. Talk with your dietitian about what dietary choices are right for you. Summary The Mediterranean diet includes both food and lifestyle choices. Eat a variety of fresh fruits and vegetables, beans, nuts, seeds, and whole grains. Limit the amount of red meat and sweets that you eat. Talk with your health care provider about whether it is safe for you to drink red wine in moderation. This means 1 glass  a day for nonpregnant women and 2 glasses a day for men. A glass of wine equals 5 oz (150 mL). This information is not intended to replace advice given to you by your health care provider. Make sure you discuss any questions you have with your health care provider. Document Released: 03/25/2016 Document Revised: 04/27/2016  Document Reviewed: 03/25/2016 Elsevier Interactive Patient Education  2017 [employer].

## 2023-06-02 NOTE — Progress Notes (Signed)
Assessment/Plan:     [place] Morse is a very pleasant 89 y.o. year old RH male with a history of hypertension, hyperlipidemia,  arthritis, hypothyroidism, BPH, history of melanoma in 2010 s/p resection with multiple extractions since, followed by Dr. Terri Pied[facility] [place], [place] degeneration, CKD2, [facility] on hearing aids, seen today for  opinion regarding therapeutic options. Unable to perform [facility] or [facility]SE today.  Most recent 05/2023 CT head shows significant atrophy R>L with chronic vascular changes, no acute findings. Findings are suspicious for advanced dementia likely due to Alzheimer's Disease with a vascular component. In the past he has tried  ACHI discontinued due to GI issues. Given his advanced age, comorbidities and risk of side effects with other mends such as memantine outweighing the benefits, no antidementia medication is recommended, He needs assistance with ADLS. He has visual hallucinations but non terrifying to him. He lives at home. Family entertaining HHN versus Palliative Care options. Gave patient's daughter information about [facility]. Family appreciative of information and resources.     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   Recommend good control of cardiovascular risk factors.   Continue to control mood as per PCP Information about [facility] and HHN provided  Folllow up as needed   Subjective:    The patient is accompanied by his stepdaughter who supplements the history.    How long did patient have memory difficulties?  For the last several years, recently progressing  Patient has difficulty remembering new information," tries to find words to verbalize".  Tried Aricept in the past, could not tolerated due to GI complaints.    repeats oneself?  Endorsed Disoriented when walking into a room? Soemtimes he wants to go to"the other house". (In [place]) Leaving  objects in unusual places? He is not mobile,so he sits in the chair.   Wandering behavior? Endorsed.    Any personality changes, or depression, anxiety?  Has moments of irritability  Hallucinations or paranoia?  Endorsed He is seeing animals (mainly bears and dogs, not scary), people (not scary), objects. No auditory hallucinations.  He believes that he has 2 houses . No paranoia Seizures? Denies.    Any sleep changes?  Sleeps well . Denies vivid dreams, REM behavior or sleepwalking.   Sleep apnea? Denies.   Any hygiene concerns? Needs to be reminder. HHN comes to help 2x a week.  Independent of bathing and dressing?  Needs assistance getting dressed.  Who is in charge of the medications? Wife  is in charge   Who is in charge of the finances?  Stepdaughter and Wife are in charge     Any changes in appetite?   Denies.     Patient have trouble swallowing?  Denies.   Does the patient cook?  No   Any headaches?  Denies.   Chronic back pain?  Denies.   Ambulates with difficulty? Needs a walker to ambulate . Yesterday he had the walker at bedside and crawled instead.    Recent falls or head injuries? Endorsed, on 05/29/23 he sustained unwitnessed fall while in the bathroom, after losing the step and hitting the occipital area with the doorknob. No LOC. No prior history of severe trauma. He had a laceration which was repaired. Vision changes? Denies. Stroke like symptoms?  Denies.   Any tremors?  Denies.   Any anosmia?  Denies.   Any incontinence of urine or bowel ? Wears Depends  sometimes he has accidents . Sometimes he does not remember to go (for both)     Patient lives with his wife   History of heavy alcohol intake? Denies.   History of heavy tobacco use? Denies.   Family history of dementia? Denies  Does patient drive? 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, L cataracts   No Known Allergies  Current Outpatient Medications  Medication Instructions   aspirin EC 81 mg,  Oral, 2 times daily   carvedilol (COREG) 25 mg, Oral, 2 times daily   diltiazem (CARDIZEM CD) 300 mg, Oral, Daily   finasteride (PROSCAR) 5 mg, Oral, Daily at bedtime   hydrochlorothiazide (HYDRODIURIL) 25 mg, Oral, Daily   lisinopril (ZESTRIL) 40 mg, Oral, Daily   Multiple Vitamins-Minerals (IMMUNE SUPPORT VITAMIN C PO) 1 tablet, Oral, Daily, L'il critter   oxyCODONE-acetaminophen (PERCOCET/ROXICET) 5-325 MG tablet 1 tablet, Oral, Every 4 hours PRN   spironolactone (ALDACTONE) 25 mg, Oral, Daily   tamsulosin (FLOMAX) 0.4 mg, Oral, Daily at bedtime   tiZANidine (ZANAFLEX) 2 mg, Oral, Every 6 hours PRN     VITALS:   Vitals:   06/02/23 1304  BP: 108/65  Pulse: 90  Resp: 18  SpO2: 99%  Weight: 183 lb (83 kg)  Height: 6\' 2"  (1.88 m)      PHYSICAL EXAM   HEENT:  Normocephalic, atraumatic. The mucous membranes are moist. The superficial temporal arteries are without ropiness or tenderness. Cardiovascular: Regular rate and rhythm. Lungs: Clear to auscultation bilaterally. Neck: There are no carotid bruits noted bilaterally.  NEUROLOGICAL:     No data to display              No data to display           Orientation:  Alert and oriented to person, not to place and time . No aphasia or dysarthria. Fund of knowledge is reduced. Recent and remote memory impaired.  Attention and concentration are reduced.  Able to name objects and unable to repeat phrases. Delayed recall    Cranial nerves: There is good facial symmetry. Extraocular muscles are intact and visual fields are full to confrontational testing. Speech is fluent and clear, no tongue deviation. Hearing is decreased to conversational tone.  Tone: Tone is good throughout. Sensation: Sensation is intact to light touch and pinprick throughout. Vibration is intact at the bilateral big toe. Coordination: The patient has no difficulty with RAM's or FNF bilaterally. Normal finger to nose  Motor: Strength is 5/5 in the bilateral  upper and lower extremities. There is no pronator drift. There are no fasciculations noted. DTR's: Deep tendon reflexes are 2/4 .  Plantar responses are downgoing bilaterally. Gait and Station: The patient is able to ambulate with difficulty. Needs a walker to ambulate  . Gait is cautious and narrow. Stride is short.      Thank you for allowing [place] the opportunity to participate in the care of this nice patient. Please do not hesitate to contact us for any questions or concerns.   Total time spent on today's visit was 60 minutes dedicated to this patient today, preparing to see patient, examining the patient, ordering tests and/or medications and counseling the patient, documenting clinical information in the EHR or other health record, independently interpreting results and communicating results to the patient/family, discussing treatment and goals, answering patient's questions and coordinating care.  Cc:  Melida Quitter, MD  Marlowe Kays 06/02/2023 1:37 PM

## 2023-06-03 NOTE — Telephone Encounter (Signed)
I sent referral to [facility] and send clinicals, etc.

## 2023-06-20 ENCOUNTER — Telehealth: Payer: Self-pay | Admitting: Physician Assistant

## 2023-06-20 NOTE — Telephone Encounter (Signed)
FYI, I will be sending orders for [facility], this was already sent once. Will need you to sign order in the am.

## 2023-06-20 NOTE — Telephone Encounter (Signed)
Patients daughter called to ask about the referral for [facility]. She states has been waiting to hear regarding the referral and has heard nothing. Please call.

## 2023-06-21 NOTE — Telephone Encounter (Signed)
I faxed the referral To [facility]

## 2023-07-05 ENCOUNTER — Ambulatory Visit (HOSPITAL_BASED_OUTPATIENT_CLINIC_OR_DEPARTMENT_OTHER): Payer: Medicare Other | Admitting: General Surgery

## 2023-07-12 ENCOUNTER — Ambulatory Visit: Payer: Medicare Other | Admitting: Podiatry

## 2023-12-14 ENCOUNTER — Observation Stay (HOSPITAL_COMMUNITY)

## 2023-12-14 ENCOUNTER — Inpatient Hospital Stay (HOSPITAL_COMMUNITY)
Admission: EM | Admit: 2023-12-14 | Discharge: 2023-12-23 | DRG: 091 | Disposition: A | Discharge status: Skilled Nursing Facility | Attending: Internal Medicine | Admitting: Internal Medicine

## 2023-12-14 ENCOUNTER — Emergency Department (HOSPITAL_COMMUNITY)

## 2023-12-14 DIAGNOSIS — L89326 Pressure-induced deep tissue damage of left buttock: Secondary | ICD-10-CM | POA: Diagnosis present

## 2023-12-14 DIAGNOSIS — Z79899 Other long term (current) drug therapy: Secondary | ICD-10-CM

## 2023-12-14 DIAGNOSIS — M25511 Pain in right shoulder: Secondary | ICD-10-CM

## 2023-12-14 DIAGNOSIS — F0394 Unspecified dementia, unspecified severity, with anxiety: Secondary | ICD-10-CM | POA: Diagnosis present

## 2023-12-14 DIAGNOSIS — Z96652 Presence of left artificial knee joint: Secondary | ICD-10-CM | POA: Diagnosis present

## 2023-12-14 DIAGNOSIS — R338 Other retention of urine: Secondary | ICD-10-CM | POA: Diagnosis present

## 2023-12-14 DIAGNOSIS — I10 Essential (primary) hypertension: Secondary | ICD-10-CM | POA: Diagnosis present

## 2023-12-14 DIAGNOSIS — Z87442 Personal history of urinary calculi: Secondary | ICD-10-CM

## 2023-12-14 DIAGNOSIS — Y9259 Other trade areas as the place of occurrence of the external cause: Secondary | ICD-10-CM

## 2023-12-14 DIAGNOSIS — N401 Enlarged prostate with lower urinary tract symptoms: Secondary | ICD-10-CM | POA: Diagnosis present

## 2023-12-14 DIAGNOSIS — R296 Repeated falls: Secondary | ICD-10-CM | POA: Diagnosis not present

## 2023-12-14 DIAGNOSIS — R55 Syncope and collapse: Secondary | ICD-10-CM | POA: Diagnosis present

## 2023-12-14 DIAGNOSIS — Z6823 Body mass index (BMI) 23.0-23.9, adult: Secondary | ICD-10-CM

## 2023-12-14 DIAGNOSIS — S20229A Contusion of unspecified back wall of thorax, initial encounter: Secondary | ICD-10-CM | POA: Diagnosis present

## 2023-12-14 DIAGNOSIS — F0390 Unspecified dementia without behavioral disturbance: Secondary | ICD-10-CM | POA: Insufficient documentation

## 2023-12-14 DIAGNOSIS — W19XXXA Unspecified fall, initial encounter: Principal | ICD-10-CM

## 2023-12-14 DIAGNOSIS — R7989 Other specified abnormal findings of blood chemistry: Secondary | ICD-10-CM | POA: Diagnosis present

## 2023-12-14 DIAGNOSIS — Z9181 History of falling: Secondary | ICD-10-CM

## 2023-12-14 DIAGNOSIS — R5381 Other malaise: Secondary | ICD-10-CM | POA: Diagnosis present

## 2023-12-14 DIAGNOSIS — E870 Hyperosmolality and hypernatremia: Secondary | ICD-10-CM | POA: Diagnosis present

## 2023-12-14 DIAGNOSIS — M8589 Other specified disorders of bone density and structure, multiple sites: Secondary | ICD-10-CM | POA: Diagnosis present

## 2023-12-14 DIAGNOSIS — R262 Difficulty in walking, not elsewhere classified: Secondary | ICD-10-CM | POA: Diagnosis not present

## 2023-12-14 DIAGNOSIS — M25552 Pain in left hip: Secondary | ICD-10-CM

## 2023-12-14 DIAGNOSIS — Z981 Arthrodesis status: Secondary | ICD-10-CM

## 2023-12-14 DIAGNOSIS — M549 Dorsalgia, unspecified: Secondary | ICD-10-CM

## 2023-12-14 DIAGNOSIS — Z8669 Personal history of other diseases of the nervous system and sense organs: Secondary | ICD-10-CM

## 2023-12-14 DIAGNOSIS — Z781 Physical restraint status: Secondary | ICD-10-CM

## 2023-12-14 DIAGNOSIS — E43 Unspecified severe protein-calorie malnutrition: Secondary | ICD-10-CM | POA: Diagnosis present

## 2023-12-14 DIAGNOSIS — Z66 Do not resuscitate: Secondary | ICD-10-CM | POA: Diagnosis not present

## 2023-12-14 DIAGNOSIS — Z79890 Hormone replacement therapy (postmenopausal): Secondary | ICD-10-CM

## 2023-12-14 DIAGNOSIS — M25512 Pain in left shoulder: Secondary | ICD-10-CM

## 2023-12-14 DIAGNOSIS — Z515 Encounter for palliative care: Secondary | ICD-10-CM

## 2023-12-14 DIAGNOSIS — D72829 Elevated white blood cell count, unspecified: Secondary | ICD-10-CM | POA: Diagnosis present

## 2023-12-14 DIAGNOSIS — N400 Enlarged prostate without lower urinary tract symptoms: Secondary | ICD-10-CM | POA: Insufficient documentation

## 2023-12-14 DIAGNOSIS — L89316 Pressure-induced deep tissue damage of right buttock: Secondary | ICD-10-CM | POA: Diagnosis present

## 2023-12-14 DIAGNOSIS — H9190 Unspecified hearing loss, unspecified ear: Secondary | ICD-10-CM | POA: Diagnosis present

## 2023-12-14 DIAGNOSIS — N509 Disorder of male genital organs, unspecified: Secondary | ICD-10-CM | POA: Diagnosis present

## 2023-12-14 DIAGNOSIS — F03911 Unspecified dementia, unspecified severity, with agitation: Secondary | ICD-10-CM | POA: Diagnosis present

## 2023-12-14 DIAGNOSIS — W01190A Fall on same level from slipping, tripping and stumbling with subsequent striking against furniture, initial encounter: Secondary | ICD-10-CM | POA: Diagnosis present

## 2023-12-14 DIAGNOSIS — Y92009 Unspecified place in unspecified non-institutional (private) residence as the place of occurrence of the external cause: Secondary | ICD-10-CM

## 2023-12-14 DIAGNOSIS — M25551 Pain in right hip: Secondary | ICD-10-CM

## 2023-12-14 DIAGNOSIS — E860 Dehydration: Secondary | ICD-10-CM | POA: Diagnosis present

## 2023-12-14 DIAGNOSIS — S3210XA Unspecified fracture of sacrum, initial encounter for closed fracture: Secondary | ICD-10-CM | POA: Diagnosis present

## 2023-12-14 DIAGNOSIS — E039 Hypothyroidism, unspecified: Secondary | ICD-10-CM | POA: Diagnosis present

## 2023-12-14 LAB — BASIC METABOLIC PANEL WITH GFR
Anion gap: 10 (ref 5–15)
BUN: 33 mg/dL — ABNORMAL HIGH (ref 8–23)
CO2: 25 mmol/L (ref 22–32)
Calcium: 8.7 mg/dL — ABNORMAL LOW (ref 8.9–10.3)
Chloride: 102 mmol/L (ref 98–111)
Creatinine, Ser: 1.14 mg/dL (ref 0.61–1.24)
GFR, Estimated: >60 mL/min (ref >60)
Glucose, Bld: 142 mg/dL — ABNORMAL HIGH (ref 70–99)
Potassium: 4 mmol/L (ref 3.5–5.1)
Sodium: 137 mmol/L (ref 135–145)

## 2023-12-14 LAB — D-DIMER, QUANTITATIVE: D-Dimer, Quant: >20 ug{FEU}/mL — ABNORMAL HIGH (ref 0.00–0.50)

## 2023-12-14 LAB — CBC WITH DIFFERENTIAL/PLATELET
Abs Immature Granulocytes: 0.16 10*3/uL — ABNORMAL HIGH (ref 0.00–0.07)
Basophils Absolute: 0.1 10*3/uL (ref 0.0–0.1)
Basophils Relative: 0 %
Eosinophils Absolute: 0 10*3/uL (ref 0.0–0.5)
Eosinophils Relative: 0 %
HCT: 37.1 % — ABNORMAL LOW (ref 39.0–52.0)
Hemoglobin: 12 g/dL — ABNORMAL LOW (ref 13.0–17.0)
Immature Granulocytes: 1 %
Lymphocytes Relative: 3 %
Lymphs Abs: 0.4 10*3/uL — ABNORMAL LOW (ref 0.7–4.0)
MCH: 27.6 pg (ref 26.0–34.0)
MCHC: 32.3 g/dL (ref 30.0–36.0)
MCV: 85.3 fL (ref 80.0–100.0)
Monocytes Absolute: 1.3 10*3/uL — ABNORMAL HIGH (ref 0.1–1.0)
Monocytes Relative: 8 %
Neutro Abs: 13.3 10*3/uL — ABNORMAL HIGH (ref 1.7–7.7)
Neutrophils Relative %: 88 %
Platelets: 234 10*3/uL (ref 150–400)
RBC: 4.35 MIL/uL (ref 4.22–5.81)
RDW: 16.2 % — ABNORMAL HIGH (ref 11.5–15.5)
WBC: 15.2 10*3/uL — ABNORMAL HIGH (ref 4.0–10.5)
nRBC: 0 % (ref 0.0–0.2)

## 2023-12-14 LAB — HEPATIC FUNCTION PANEL
ALT: 20 U/L (ref 0–44)
AST: 42 U/L — ABNORMAL HIGH (ref 15–41)
Albumin: 3.1 g/dL — ABNORMAL LOW (ref 3.5–5.0)
Alkaline Phosphatase: 86 U/L (ref 38–126)
Bilirubin, Direct: 0.2 mg/dL (ref 0.0–0.2)
Indirect Bilirubin: 0.8 mg/dL (ref 0.3–0.9)
Total Bilirubin: 1 mg/dL (ref 0.0–1.2)
Total Protein: 6.6 g/dL (ref 6.5–8.1)

## 2023-12-14 LAB — TROPONIN I (HIGH SENSITIVITY): Troponin I (High Sensitivity): 17 ng/L (ref <18)

## 2023-12-14 MED ORDER — SODIUM CHLORIDE 0.9 % IV SOLN: Freq: Once | INTRAVENOUS | Status: AC

## 2023-12-14 MED ORDER — IOHEXOL 350 MG/ML SOLN
65.0000 mL | Freq: Once | INTRAVENOUS | Status: AC | PRN
  Administered 2023-12-14: 65 mL via INTRAVENOUS

## 2023-12-14 MED ORDER — TAMSULOSIN HCL 0.4 MG PO CAPS
0.4000 mg | ORAL_CAPSULE | Freq: Every day | ORAL | Status: DC
  Administered 2023-12-14 – 2023-12-21 (×7): 0.4 mg via ORAL
  Filled 2023-12-14 (×8): qty 1

## 2023-12-14 MED ORDER — MORPHINE SULFATE (PF) 2 MG/ML IV SOLN: 2.0000 mg | INTRAVENOUS | Status: DC | PRN

## 2023-12-14 MED ORDER — FINASTERIDE 5 MG PO TABS
5.0000 mg | ORAL_TABLET | Freq: Every day | ORAL | Status: DC
Start: 2023-12-14 — End: 2023-12-23
  Administered 2023-12-14 – 2023-12-21 (×7): 5 mg via ORAL
  Filled 2023-12-14 (×8): qty 1

## 2023-12-14 MED ORDER — HYDROCODONE-ACETAMINOPHEN 5-325 MG PO TABS
1.0000 | ORAL_TABLET | ORAL | Status: DC | PRN
  Administered 2023-12-14 – 2023-12-15 (×2): 1 via ORAL
  Filled 2023-12-14 (×2): qty 1

## 2023-12-14 MED ORDER — LEVOTHYROXINE SODIUM 25 MCG PO TABS
25.0000 ug | ORAL_TABLET | Freq: Every day | ORAL | Status: DC
  Administered 2023-12-15 – 2023-12-23 (×8): 25 ug via ORAL
  Filled 2023-12-14 (×8): qty 1

## 2023-12-14 MED ORDER — ACETAMINOPHEN 650 MG RE SUPP: 650.0000 mg | Freq: Four times a day (QID) | RECTAL | Status: DC | PRN

## 2023-12-14 MED ORDER — SODIUM CHLORIDE 0.9% FLUSH
3.0000 mL | Freq: Two times a day (BID) | INTRAVENOUS | Status: DC
  Administered 2023-12-15 – 2023-12-23 (×15): 3 mL via INTRAVENOUS

## 2023-12-14 MED ORDER — FENTANYL CITRATE PF 50 MCG/ML IJ SOSY
50.0000 ug | PREFILLED_SYRINGE | Freq: Once | INTRAMUSCULAR | Status: AC
  Administered 2023-12-14: 50 ug via INTRAVENOUS
  Filled 2023-12-14: qty 1

## 2023-12-14 MED ORDER — ACETAMINOPHEN 325 MG PO TABS
650.0000 mg | ORAL_TABLET | Freq: Four times a day (QID) | ORAL | Status: DC | PRN
  Administered 2023-12-17 – 2023-12-20 (×3): 650 mg via ORAL
  Filled 2023-12-14 (×3): qty 2

## 2023-12-14 MED ORDER — HEPARIN SODIUM (PORCINE) 5000 UNIT/ML IJ SOLN
5000.0000 [IU] | Freq: Two times a day (BID) | INTRAMUSCULAR | Status: DC
  Administered 2023-12-14 – 2023-12-23 (×16): 5000 [IU] via SUBCUTANEOUS
  Filled 2023-12-14 (×17): qty 1

## 2023-12-14 MED ORDER — FENTANYL CITRATE PF 50 MCG/ML IJ SOSY
25.0000 ug | PREFILLED_SYRINGE | Freq: Once | INTRAMUSCULAR | Status: AC
  Administered 2023-12-14: 25 ug via INTRAVENOUS
  Filled 2023-12-14: qty 1

## 2023-12-14 NOTE — ED Notes (Signed)
 Pt back from MRI

## 2023-12-14 NOTE — ED Provider Notes (Signed)
 [place] EMERGENCY DEPARTMENT AT [facility] Provider Note   CSN: [id number] Arrival date & time: 12/14/23  4098     History  Chief Complaint  Patient presents with   Gilbert Morse is a 89 y.o. male.  The history is provided by the patient.  Fall  He has history of hypertension, falls and comes in following a fall at the hotel room where he was staying.  He states that he slipped and fell and injured both shoulders and both hips.  He had told triage that he also hurt his lower back although he did not complain about that to me.  He denies head injury or loss of consciousness.   Home Medications Prior to Admission medications   Medication Sig Start Date End Date Taking? Authorizing Provider  cyanocobalamin 100 MCG tablet daily Oral 09/27/19   [provider]  diltiazem  (CARDIZEM  CD) 300 MG 24 hr capsule Take 300 mg by mouth daily. 01/12/16   [provider]  finasteride  (PROSCAR ) 5 MG tablet Take 5 mg by mouth at bedtime. 05/22/19   [provider]  hydrochlorothiazide  (HYDRODIURIL ) 25 MG tablet Take 25 mg by mouth daily. 05/22/19   [provider]  levothyroxine (SYNTHROID) 25 MCG tablet Take 25 mcg by mouth daily before breakfast.    [provider]  spironolactone  (ALDACTONE ) 25 MG tablet Take 25 mg by mouth daily. 05/22/19   [provider]  tamsulosin  (FLOMAX ) 0.4 MG CAPS capsule Take 0.4 mg by mouth at bedtime. 05/22/19   [provider]      Allergies    Patient has no known allergies.    Review of Systems   Review of Systems  All other systems reviewed and are negative.   Physical Exam Updated Vital Signs BP (!) 140/65   Pulse 64   Temp 98 F (36.7 C) (Oral)   Resp (!) 21   SpO2 96%  Physical Exam Vitals and nursing note reviewed.   89 year old male, resting comfortably and in no acute distress. Vital signs are normal. Oxygen saturation is 96%, which is normal. Head is  normocephalic and atraumatic. PERRLA, EOMI.  Neck is nontender. Back is nontender. Lungs are clear without rales, wheezes, or rhonchi. Chest is nontender. Heart has regular rate and rhythm without murmur. Abdomen is soft, flat, nontender. Extremities: No swelling or deformity.  There is tenderness palpation over both his and both hips and pain with passive range of motion of both shoulders and hips.  No other areas of tenderness elicited, full range of motion present all other joints without pain. Skin is warm and dry without rash. Neurologic: Awake and alert, no focal neurologic deficit noted although movement of all 4 extremities is restricted by pain.  ED Results / Procedures / Treatments    Radiology DG Lumbar Spine Complete Result Date: 12/14/2023 CLINICAL DATA:  89 year old male status post fall with soft tissue injury, abrasion, skin tear. Pain. EXAM: LUMBAR SPINE - COMPLETE 4+ VIEW COMPARISON:  Lumbar radiographs 11/04/2009. FINDINGS: Aortoiliac calcified atherosclerosis. Nonobstructed visible bowel gas pattern. Tortuous abdominal aorta. Osteopenia. Chronic lumbar spinal ankylosis, with chronic non fusion of what appears to be the T12-L1 level and bulky chronic disc and endplate degeneration there which has progressed since 2011, along with vacuum disc. Visible lower thoracic levels thin through T12 also appear ankylosed. Maintained vertebral height. No acute osseous abnormality identified. IMPRESSION: 1. Osteopenia and subtotal chronic lower thoracic and lumbar spinal ankylosis, absent  at T12-L1 with chronic severe disc and endplate degeneration there. No acute osseous abnormality identified. 2.  Aortic Atherosclerosis (ICD10-I70.0). Electronically Signed   By: Marlise Simpers M.D.   On: 12/14/2023 07:31   DG Hips Bilat W or Wo Pelvis 3-4 Views Result Date: 12/14/2023 CLINICAL DATA:  89 year old male status post fall with soft tissue injury, abrasion, skin tear. Pain. Pelvis and right hip series  09/23/2008. EXAM: DG HIP (WITH OR WITHOUT PELVIS) 3-4V BILAT COMPARISON:  None Available. FINDINGS: Five views at 0649 hours. Tortuous and calcified distal abdominal aorta, aortoiliac bifurcation. Pelvis osteopenia. Femoral heads remain normally located. No pelvis fracture identified. SI joints appear symmetric, pubic symphysis within normal limits. No proximal right femur fracture identified. Proximal femurs appear stable compared to 2010. Nonobstructed bowel-gas pattern. IMPRESSION: 1. Osteopenia. No acute fracture or dislocation identified about the bilateral hips or pelvis. If occult hip fracture is suspected or if the patient is unable to weightbear, MRI is the preferred modality for further evaluation. 2.  Aortic Atherosclerosis (ICD10-I70.0). Electronically Signed   By: Marlise Simpers M.D.   On: 12/14/2023 07:29   DG Shoulder Left Result Date: 12/14/2023 CLINICAL DATA:  89 year old male status post fall with soft tissue injury, abrasion, skin tear. Pain. EXAM: LEFT SHOULDER - 2+ VIEW COMPARISON:  Chest radiographs 06/27/2019. FINDINGS: Four views at 0701 hours. No glenohumeral joint dislocation. Superior subluxation of the left humeral head, but compared to the right side today there is mild to moderate left glenohumeral joint space loss and spurring. Proximal left humerus appears intact. No fracture of the left clavicle or scapula identified. Left rib osteopenia. No acute fracture of the visible left ribs identified. Negative visible left lung parenchyma. Calcified aortic atherosclerosis. IMPRESSION: No acute fracture or dislocation identified about the left shoulder. Electronically Signed   By: Marlise Simpers M.D.   On: 12/14/2023 07:27   DG Shoulder Right Result Date: 12/14/2023 CLINICAL DATA:  89 year old male status post fall with soft tissue injury, abrasion, skin tear. Pain. EXAM: RIGHT SHOULDER - 2+ VIEW COMPARISON:  Chest radiographs 06/27/2019. FINDINGS: Three views at 0702 hours. Maintained glenohumeral  joint alignment with superior subluxation of the humeral head, severe glenohumeral joint space loss, subchondral sclerosis, spurring. Proximal right humerus appears intact. No right clavicle or scapula fracture identified. Visible right ribs and lung appear negative. IMPRESSION: 1. No acute fracture or dislocation identified about the right shoulder. 2. 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 Electronically Signed   By: Marlise Simpers M.D.   On: 12/14/2023 07:26   CT Head Wo Contrast Result Date: 12/14/2023 CLINICAL DATA:  Head and neck trauma.  Fall injury. EXAM: CT HEAD WITHOUT CONTRAST CT CERVICAL SPINE WITHOUT CONTRAST TECHNIQUE: Multidetector CT imaging of the head and cervical spine was performed following the standard protocol without intravenous contrast. Multiplanar CT image reconstructions of the cervical spine were also generated. RADIATION DOSE REDUCTION: This exam was performed according to the departmental dose-optimization program which includes automated exposure control, adjustment of the mA and/or kV according to patient size and/or use of iterative reconstruction technique. COMPARISON:  CT scan head and cervical spine both 05/29/2023 FINDINGS: CT HEAD FINDINGS Brain: There is moderate to severe cerebral atrophy, atrophic ventriculomegaly, and small-vessel disease with mild cerebellar atrophy. No cortical based evolving infarct, hemorrhage, mass or mass effect are seen. There is no midline shift.  Basal cisterns are clear. Vascular: There are patchy calcifications in both siphons. No hyperdense central vessels. Skull: Osteopenia. Negative for fractures or  focal lesions. A metal BB again noted imbedded in the soft tissues of the anterior right cheek. Sinuses/Orbits: No acute finding. Other: None. CT CERVICAL SPINE FINDINGS Alignment: There is chronic slight cervical kyphodextroscoliosis, minimal chronic grade 1 anterolisthesis likely degenerative again noted C3-4, C7-T1,  T1-2. Skull base and vertebrae: Patient motion limits fine detail particularly on the sagittal and coronal reformats. No compression fracture is seen or obvious displaced fracture. There is osteopenia. No focal pathologic process is seen through the motion artifacts. Soft tissues and spinal canal: No prevertebral fluid or swelling. No visible canal hematoma. There is carotid atherosclerosis. No thyroid mass. Disc levels: The discs are diffusely degenerated and collapsed with bidirectional endplate spurring. There is ankylosis across the left facet joints C2-4, multilevel interbody ankylosis via anterior bridging osteophytes. At C4-5, posterior disc osteophyte complex again compresses the left hemicord and the thecal sac is 5.5 mm AP. Other levels do not show spondylotic cord compression, but there is again noted multilevel severe degenerative foraminal stenosis related to old facet hypertrophy, uncinate spurring. This is most severe from C3-4 through C5-6 although is not as well depicted given patient motion. Upper chest: Negative. Other: None. IMPRESSION: 1. No acute intracranial CT findings or depressed skull fractures. 2. Osteopenia and degenerative change of the cervical spine without evidence of fractures or traumatic listhesis. Patient motion limits fine detail. 3. Chronic slight cervical kyphodextroscoliosis and minimal chronic grade 1 anterolisthesis C3-4, C7-T1, T1-2. 4. Multilevel severe degenerative foraminal stenosis. 5. 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 6. Carotid atherosclerosis. 7. Metal BB imbedded in the soft tissues of the anterior right cheek, seen previously. Electronically Signed   By: Denman Fischer M.D.   On: 12/14/2023 07:15   CT Cervical Spine Wo Contrast Result Date: 12/14/2023 CLINICAL DATA:  Head and neck trauma.  Fall injury. EXAM: CT HEAD WITHOUT CONTRAST CT CERVICAL SPINE WITHOUT CONTRAST TECHNIQUE: Multidetector CT imaging of the head and cervical spine was performed  following the standard protocol without intravenous contrast. Multiplanar CT image reconstructions of the cervical spine were also generated. RADIATION DOSE REDUCTION: This exam was performed according to the departmental dose-optimization program which includes automated exposure control, adjustment of the mA and/or kV according to patient size and/or use of iterative reconstruction technique. COMPARISON:  CT scan head and cervical spine both 05/29/2023 FINDINGS: CT HEAD FINDINGS Brain: There is moderate to severe cerebral atrophy, atrophic ventriculomegaly, and small-vessel disease with mild cerebellar atrophy. No cortical based evolving infarct, hemorrhage, mass or mass effect are seen. There is no midline shift.  Basal cisterns are clear. Vascular: There are patchy calcifications in both siphons. No hyperdense central vessels. Skull: Osteopenia. Negative for fractures or focal lesions. A metal BB again noted imbedded in the soft tissues of the anterior right cheek. Sinuses/Orbits: No acute finding. Other: None. CT CERVICAL SPINE FINDINGS Alignment: There is chronic slight cervical kyphodextroscoliosis, minimal chronic grade 1 anterolisthesis likely degenerative again noted C3-4, C7-T1, T1-2. Skull base and vertebrae: Patient motion limits fine detail particularly on the sagittal and coronal reformats. No compression fracture is seen or obvious displaced fracture. There is osteopenia. No focal pathologic process is seen through the motion artifacts. Soft tissues and spinal canal: No prevertebral fluid or swelling. No visible canal hematoma. There is carotid atherosclerosis. No thyroid mass. Disc levels: The discs are diffusely degenerated and collapsed with bidirectional endplate spurring. There is ankylosis across the left facet joints C2-4, multilevel interbody ankylosis via anterior bridging osteophytes. At C4-5, posterior disc osteophyte complex again compresses the  left hemicord and the thecal sac is 5.5 mm  AP. Other levels do not show spondylotic cord compression, but there is again noted multilevel severe degenerative foraminal stenosis related to old facet hypertrophy, uncinate spurring. This is most severe from C3-4 through C5-6 although is not as well depicted given patient motion. Upper chest: Negative. Other: None. IMPRESSION: 1. No acute intracranial CT findings or depressed skull fractures. 2. Osteopenia and degenerative change of the cervical spine without evidence of fractures or traumatic listhesis. Patient motion limits fine detail. 3. Chronic slight cervical kyphodextroscoliosis and minimal chronic grade 1 anterolisthesis C3-4, C7-T1, T1-2. 4. Multilevel severe degenerative foraminal stenosis. 5. 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 6. Carotid atherosclerosis. 7. Metal BB imbedded in the soft tissues of the anterior right cheek, seen previously. Electronically Signed   By: Denman Fischer M.D.   On: 12/14/2023 07:15    Procedures Procedures  Cardiac monitor shows normal sinus rhythm, per my interpretation.  Medications Ordered in ED Medications - No data to display  ED Course/ Medical Decision Making/ A&P                                 Medical Decision Making Amount and/or Complexity of Data Reviewed Radiology: ordered.   Fall with injury to both shoulders and both hips and possible injury to lower back.  I have ordered x-rays of these areas as well as CT of head and cervical spine.  CT scans show no acute injury.  Shoulder and hip x-rays also show no acute injury although significant osteopenia and degenerative changes.  I have independently viewed all of the images, and agree with the radiologist's interpretation.  I have ordered a trial of ambulation.  If he is able to ambulate, I feel he can safely be discharged.  However, if he is unable to ambulate he will need advanced imaging to rule out occult fracture of hips.  Case is signed out to Dr. Dolan Freiberg.  Final Clinical  Impression(s) / ED Diagnoses Final diagnoses:  Fall, initial encounter  Acute pain of both shoulders  Pain of both hip joints    Rx / DC Orders ED Discharge Orders     None         Alissa April, MD 12/14/23 [phone]

## 2023-12-14 NOTE — ED Provider Notes (Signed)
 Blood pressure (!) 140/65, pulse 64, temperature 98 F (36.7 C), temperature source Oral, resp. rate (!) 21, SpO2 96%.  Assuming care from Dr. Candelaria Chaco.  In short, Gilbert Morse is a 89 y.o. male with a chief complaint of Fall .  Refer to the original H&P for additional details.  The current plan of care is to follow up after ambulation.  08:25 AM Patient in too much pain to ambulate. Will need further imaging to evaluate for occult fracture. MRI is the preferred modality per radiology.   MRI with chronic findings. Recommended CT at that point, which was done.   CT shows a possible S1 deformity. Discussed with Dr. Michale Age with Neurosurgery. Patient can WBAT. No specific surgical intervention or follow up required. Plan to admit for pain control. Patient requiring multiple rounds of fentanyl  to perform CT/MRI and unable to stand and walk at this time, due to pain.   Discussed patient's case with [facility] to request admission. Patient and family (if present) updated with plan.  I reviewed all nursing notes, vitals, pertinent old records, EKGs, labs, imaging (as available).    Roberts Ching, MD 12/15/23 [phone]

## 2023-12-14 NOTE — ED Notes (Signed)
 Pt to MRI

## 2023-12-14 NOTE — ED Notes (Signed)
 Attempted to ambulate. Pt would not attempt due to leg pain. MD made aware.

## 2023-12-14 NOTE — Progress Notes (Signed)
 [name]- [facility] Liaison Note                  This patient is enrolled in the Outpatient Palliative Care program of [facility], as well as the [facility] program receiving PT and aide services.  If patient is admitted, please reorder the appropriate [facility] at discharge.    Hospital Liaison Team will follow for disposition.   Please reach out if there are questions or concerns.   Madelene Schanz, BSN, RN, [facility]  [phone]

## 2023-12-14 NOTE — ED Triage Notes (Signed)
 Pt BIB [facility] from home d/t fall.  Pt let go of walker and hit table on  left shoulder (has large skin tear).  Pt c/o also of bilat hip paina nd lower back pain - did not hit head or have LOC - no thinners.

## 2023-12-14 NOTE — H&P (Signed)
 History and Physical    Patient: Gilbert Morse DOB: December 07, 1930 DOA: 12/14/2023 DOS: the patient was seen and examined on 12/14/2023 PCP: [name], MD  Patient coming from: Home.  Chief complaint: Chief Complaint  Patient presents with   Fall   HPI:  Gilbert Morse is a 89 y.o. male with past medical history  of hearing loss, vitreous hemorrhage,Hypertension, hypothyroidism, BPH, coming from home due to a fall.  Patient brought by EMS.  Patient uses a walker and hurt his left shoulder no reports of loss of consciousness.  And did not hit his head per report.  Patient is a limited historian.  Patient is part of the palliative care program with other healthcare and has home health services and physical therapy and home health aide.  ED Course: Pt in ed at bedside  is awake but appears confused suspect this may be secondary to underlying dementia. Vital signs in the ED were notable for the following:  Vitals:   12/14/23 1146 12/14/23 1425 12/14/23 1635 12/14/23 1715  BP: (!) 141/65 (!) 141/113 124/71 (!) 141/86  Pulse: 82 85 86 85  Temp:   (!) 97.3 F (36.3 C)   Resp: 18 18 19  (!) 28  SpO2:  98% 100% 99%  TempSrc:   Oral   >>ED evaluation thus far shows: BMP showing glucose 142 normal kidney function LFTs added on and pending. CBC showing leukocytosis of 15.2 hemoglobin of 12 normal platelets normal differential otherwise. D-dimer more than 20- CTA ordered / pending. EKG ordered and pending, troponin ordered and pending.   >>While in the ED patient received the following: Medications  finasteride  (PROSCAR ) tablet 5 mg (has no administration in time range)  levothyroxine (SYNTHROID) tablet 25 mcg (has no administration in time range)  tamsulosin  (FLOMAX ) capsule 0.4 mg (has no administration in time range)  heparin injection 5,000 Units (has no administration in time range)  sodium chloride  flush (NS) 0.9 % injection 3 mL (has no administration in time range)   acetaminophen  (TYLENOL ) tablet 650 mg (has no administration in time range)    Or  acetaminophen  (TYLENOL ) suppository 650 mg (has no administration in time range)  HYDROcodone-acetaminophen  (NORCO/VICODIN) 5-325 MG per tablet 1 tablet (has no administration in time range)  morphine (PF) 2 MG/ML injection 2 mg (has no administration in time range)  fentaNYL  (SUBLIMAZE ) injection 50 mcg (50 mcg Intravenous Given 12/14/23 0841)  fentaNYL  (SUBLIMAZE ) injection 25 mcg (25 mcg Intravenous Given 12/14/23 1146)  0.9 %  sodium chloride  infusion ( Intravenous New Bag/Given 12/14/23 1651)   Review of Systems  Unable to perform ROS: Dementia   Past Medical History:  Diagnosis Date   Arthritis    Enlarged prostate    Falls 06/27/2019   last fall was 2 weeks ago    Heart murmur    History of kidney stones    only one as a young man , passed independently    HTN (hypertension)    Past Surgical History:  Procedure Laterality Date   BACK SURGERY     total 6 surgeries  . inluding discectomy, fusion with harvest from right hip", rods in place    TOTAL KNEE ARTHROPLASTY Left 07/02/2019   Procedure: Left Knee Arthroplasty;  Surgeon: Wendolyn Hamburger, MD;  Location: [facility];  Service: Orthopedics;  Laterality: Left;    reports that he has never smoked. He has never used smokeless tobacco. He reports current alcohol use. No history on file for drug use.  No Known Allergies No family history on file. Prior to Admission medications   Medication Sig Start Date End Date Taking? Authorizing Provider  cyanocobalamin 100 MCG tablet daily Oral 09/27/19   [provider]  diltiazem  (CARDIZEM  CD) 300 MG 24 hr capsule Take 300 mg by mouth daily. 01/12/16   [provider]  finasteride  (PROSCAR ) 5 MG tablet Take 5 mg by mouth at bedtime. 05/22/19   [provider]  hydrochlorothiazide  (HYDRODIURIL ) 25 MG tablet Take 25 mg by mouth daily. 05/22/19   [provider]  levothyroxine  (SYNTHROID) 25 MCG tablet Take 25 mcg by mouth daily before breakfast.    [provider]  spironolactone  (ALDACTONE ) 25 MG tablet Take 25 mg by mouth daily. 05/22/19   [provider]  tamsulosin  (FLOMAX ) 0.4 MG CAPS capsule Take 0.4 mg by mouth at bedtime. 05/22/19   [provider]                                                                                 Vitals:   12/14/23 1146 12/14/23 1425 12/14/23 1635 12/14/23 1715  BP: (!) 141/65 (!) 141/113 124/71 (!) 141/86  Pulse: 82 85 86 85  Resp: 18 18 19  (!) 28  Temp:   (!) 97.3 F (36.3 C)   TempSrc:   Oral   SpO2:  98% 100% 99%   Physical Exam Constitutional:      General: He is not in acute distress.    Appearance: He is not ill-appearing.  HENT:     Head: Normocephalic and atraumatic.  Cardiovascular:     Rate and Rhythm: Normal rate and regular rhythm.     Pulses:          Dorsalis pedis pulses are 2+ on the right side and 2+ on the left side.     Heart sounds: Normal heart sounds.  Pulmonary:     Effort: Pulmonary effort is normal.     Breath sounds: Normal breath sounds.  Abdominal:     General: Bowel sounds are normal.     Palpations: Abdomen is soft.  Musculoskeletal:     Right lower leg: 1+ Edema present.     Left lower leg: 1+ Edema present.  Neurological:     General: No focal deficit present.     Mental Status: He is alert. He is disoriented.     Labs on Admission: I have personally reviewed following labs and imaging studies CBC: Recent Labs  Lab 12/14/23 1143  WBC 15.2*  NEUTROABS 13.3*  HGB 12.0*  HCT 37.1*  MCV 85.3  PLT 234   Basic Metabolic Panel: Recent Labs  Lab 12/14/23 1143  NA 137  K 4.0  CL 102  CO2 25  GLUCOSE 142*  BUN 33*  CREATININE 1.14  CALCIUM 8.7*   Radiological Exams on Admission: CT Lumbar Spine Wo Contrast Result Date: 12/14/2023 CLINICAL DATA:  Lumbar radiculopathy, fall with severe lower back pain. EXAM: CT LUMBAR SPINE WITHOUT  CONTRAST TECHNIQUE: Multidetector CT imaging of the lumbar spine was performed without intravenous contrast administration. Multiplanar CT image reconstructions were also generated. RADIATION DOSE REDUCTION: This exam was performed according to the departmental dose-optimization  program which includes automated exposure control, adjustment of the mA and/or kV according to patient size and/or use of iterative reconstruction technique. COMPARISON:  MRI lumbar spine 12/14/2023. FINDINGS: Segmentation: 5 lumbar type vertebrae. Alignment: Straightening of the normal lumbar lordosis. Trace retrolisthesis of L5 relative to S1. Widening of the L5-S1 disc space corresponding to findings of large fluid-filled cleft on MRI. Vertebrae: Ankylosis at the T11-T12 level. Additional ankylosis from L1-2 L5. T12-L1 level remains mobile. Diffuse osteopenia. No evidence of compression fracture or displaced fracture in the lumbar spine. Limited evaluation for nondisplaced fracture given the degree of osteopenia. There is slight irregularity along the anterior cortex of S1 which may reflect mildly displaced fracture. Ankylosis of the bilateral sacroiliac joints. Paraspinal and other soft tissues: Atrophy of the paraspinal musculature. The visualized paraspinal soft tissues are otherwise unremarkable. Moderate atherosclerosis of the abdominal aorta and branch vessels. Moderately distended urinary bladder with irregular contour which may reflect multiple bladder diverticuli. Disc levels: Moderate disc space narrowing at T12-L1 with degenerative endplate changes and prominent endplate osteophytes. Disc bulge and posterior osteophytes along with facet arthrosis resulting in moderate spinal canal stenosis. There is mild-to-moderate osseous foraminal stenosis on the right. Additional disc bulges and posterior osteophytes at multiple levels in the lumbar spine from L1-2 to L4-5 without high-grade osseous spinal canal stenosis. No high-grade  osseous foraminal stenosis. No evidence of high-grade stenosis at L5-S1. Prominent facet overgrowth on the right at L5-S1 resulting in moderate foraminal narrowing. IMPRESSION: Irregularity of the anterior cortex of S1 which may reflect mildly displaced fracture. No significant retropulsion. No evidence of acute traumatic injury at T12-L1. Degenerative changes of the lumbar spine as above. Disc bulge and prominent osteophytes at T12-L1 resulting in moderate osseous spinal canal stenosis. Moderate osseous foraminal stenosis on the right at L5-S1. Redemonstrated fluid cleft at the L5-S1 disc space which is better evaluated on same day MRI. Moderately distended urinary bladder with irregular contour which may reflect multiple bladder diverticuli. Electronically Signed   By: Denny Flack M.D.   On: 12/14/2023 12:27   MR LUMBAR SPINE WO CONTRAST Result Date: 12/14/2023 CLINICAL DATA:  Ataxia. Fell. Low back pain and bilateral hip pain. EXAM: MRI LUMBAR SPINE WITHOUT CONTRAST TECHNIQUE: Multiplanar, multisequence MR imaging of the lumbar spine was performed. No intravenous contrast was administered. COMPARISON:  Radiography same day. MRI 10/16/2008. CT pelvis 09/23/2008. FINDINGS: Segmentation: 5 lumbar type vertebral bodies as numbered previously. Alignment:  No malalignment presently. Vertebrae: There is ankylosis of the spine in the lower thoracic region from inferior T10 through T12. T12-L1 remains a mobile level. There is ankylosis from L1 through L4. There is a large fluid-filled cleft at the L5 level the relates in some way to an old fracture at the L5 level. Chronic and possibly acute fracture lines extend through the posterior elements at this level. Present I think many of the findings relate to the old fracture with chronic nonunion, I cannot exclude an element of acute injury at this level and think that a lumbar CT scan would be useful to correlate with this exam. Conus medullaris and cauda equina: Conus  extends to the T12-L1 level. See below regarding stenosis at this level. Conus and cauda equina appear otherwise normal. Paraspinal and other soft tissues: Negative Disc levels: T12-L1: This remains a mobile level. There is disc degeneration with loss of disc height. There are circumferential endplate osteophytes. The facets show arthropathy with facet and ligamentous hypertrophy. There is spinal stenosis at this level that could  have compressive effect upon the conus tip and nerve roots of the cauda equina. I do not see an acute traumatic finding at this level but would suggest this be evaluated with CT. L1 through L4: Chronic ankylosis with sufficient patency of the canal and foramina. At L5, there is a large fluid-filled cleft measuring up to 2.3 cm in height. I suspect that this is chronic sequela of an old nonunited fracture at L5. There appears to be chronic extension through the posterior elements at this level in there could be ongoing motion. I would recommend a CT scan of this area as it is impossible to rule out some acute injury superimposed on what are probably largely chronic findings. IMPRESSION: 1. Underlying ankylosing disease of the spine. 2. T12-L1 remains a mobile level. There is disc degeneration with loss of disc height and circumferential endplate osteophytes. There is facet and ligamentous hypertrophy. There is spinal stenosis at this level that could have compressive effect upon the conus tip and nerve roots of the cauda equina. I do not see an acute traumatic finding at this level but would suggest this be evaluated with CT. 3. At L5, there is a large fluid-filled cleft measuring up to 2.3 cm in height. I suspect that this is chronic sequela of an old nonunited fracture at L5. There appears to be chronic extension through the posterior elements at this level and there could be ongoing motion. I would recommend a CT scan of this area as it is impossible to exclude some acute injury  superimposed on what are probably largely chronic findings. Electronically Signed   By: Bettylou Brunner M.D.   On: 12/14/2023 11:01   MR HIP RIGHT WO CONTRAST Result Date: 12/14/2023 CLINICAL DATA:  Fall. EXAM: MR OF THE RIGHT HIP WITHOUT CONTRAST TECHNIQUE: Multiplanar, multisequence MR imaging was performed. No intravenous contrast was administered. COMPARISON:  Hip radiographs dated 12/14/2023 at 7:08 a.m. FINDINGS: Examination is significantly limited due to motion degradation. Soft tissue and Muscle: There is no significant focal soft tissue swelling.Hamstring tendon origins are intact.Gluteal cuff tendon insertions are intact.Moderate-to-large diverticulum at the right superior bladder dome with additional smaller scattered diverticula and trabecular appearance of the bladder. Bones: No acute osseous abnormality of the pelvis or bilateral proximal femora. The bone marrow signal intensity is within normal limits. Partially evaluated ankylosis of the lumbar spine with cleft like defect again noted at the L5 level, likely related to sequela of prior L5 fracture. Please refer to the same-day MRI of the lumbar spine for description of lumbar spine findings. Hip: The hip joint is anatomically aligned. Mild superolateral joint space narrowing and marginal osteophytosis.The labrum is grossly intact, although evaluation is limited by lack of intra-articular fluid/contrast.No hip joint effusion. Ligamentum teres and transverse ligaments are intact. IMPRESSION: Evaluation is limited due to motion degradation. 1. No acute osseous abnormality. 2. Partially evaluated ankylosis of the lumbar spine with cleft like defect again noted at the L5 level, likely related to sequela of prior L5 fracture. Please refer to the same-day MRI of the lumbar spine for description of lumbar spine findings. Electronically Signed   By: Mannie Seek M.D.   On: 12/14/2023 11:00   CT Head Wo Contrast Addendum Date: 12/14/2023 ADDENDUM  REPORT: 12/14/2023 07:53 ADDENDUM: Not mentioned above, there is soft tissue thickening right of the midline along right ocular retinal layer. This was seen previously but clinical significance indeterminate despite lack of interval change. This could simply be under old retinal hemorrhage but  other etiologies are possible. Detailed ophthalmological or optometric exam is recommended. Electronically Signed   By: Denman Fischer M.D.   On: 12/14/2023 07:53   Result Date: 12/14/2023 CLINICAL DATA:  Head and neck trauma.  Fall injury. EXAM: CT HEAD WITHOUT CONTRAST CT CERVICAL SPINE WITHOUT CONTRAST TECHNIQUE: Multidetector CT imaging of the head and cervical spine was performed following the standard protocol without intravenous contrast. Multiplanar CT image reconstructions of the cervical spine were also generated. RADIATION DOSE REDUCTION: This exam was performed according to the departmental dose-optimization program which includes automated exposure control, adjustment of the mA and/or kV according to patient size and/or use of iterative reconstruction technique. COMPARISON:  CT scan head and cervical spine both 05/29/2023 FINDINGS: CT HEAD FINDINGS Brain: There is moderate to severe cerebral atrophy, atrophic ventriculomegaly, and small-vessel disease with mild cerebellar atrophy. No cortical based evolving infarct, hemorrhage, mass or mass effect are seen. There is no midline shift.  Basal cisterns are clear. Vascular: There are patchy calcifications in both siphons. No hyperdense central vessels. Skull: Osteopenia. Negative for fractures or focal lesions. A metal BB again noted imbedded in the soft tissues of the anterior right cheek. Sinuses/Orbits: No acute finding. Other: None. CT CERVICAL SPINE FINDINGS Alignment: There is chronic slight cervical kyphodextroscoliosis, minimal chronic grade 1 anterolisthesis likely degenerative again noted C3-4, C7-T1, T1-2. Skull base and vertebrae: Patient motion limits  fine detail particularly on the sagittal and coronal reformats. No compression fracture is seen or obvious displaced fracture. There is osteopenia. No focal pathologic process is seen through the motion artifacts. Soft tissues and spinal canal: No prevertebral fluid or swelling. No visible canal hematoma. There is carotid atherosclerosis. No thyroid mass. Disc levels: The discs are diffusely degenerated and collapsed with bidirectional endplate spurring. There is ankylosis across the left facet joints C2-4, multilevel interbody ankylosis via anterior bridging osteophytes. At C4-5, posterior disc osteophyte complex again compresses the left hemicord and the thecal sac is 5.5 mm AP. Other levels do not show spondylotic cord compression, but there is again noted multilevel severe degenerative foraminal stenosis related to old facet hypertrophy, uncinate spurring. This is most severe from C3-4 through C5-6 although is not as well depicted given patient motion. Upper chest: Negative. Other: None. IMPRESSION: 1. No acute intracranial CT findings or depressed skull fractures. 2. Osteopenia and degenerative change of the cervical spine without evidence of fractures or traumatic listhesis. Patient motion limits fine detail. 3. Chronic slight cervical kyphodextroscoliosis and minimal chronic grade 1 anterolisthesis C3-4, C7-T1, T1-2. 4. Multilevel severe degenerative foraminal stenosis. 5. 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 6. Carotid atherosclerosis. 7. Metal BB imbedded in the soft tissues of the anterior right cheek, seen previously. Electronically Signed: By: Denman Fischer M.D. On: 12/14/2023 07:15   CT Cervical Spine Wo Contrast Addendum Date: 12/14/2023 ADDENDUM REPORT: 12/14/2023 07:53 ADDENDUM: Not mentioned above, there is soft tissue thickening right of the midline along right ocular retinal layer. This was seen previously but clinical significance indeterminate despite lack of interval change. This  could simply be under old retinal hemorrhage but other etiologies are possible. Detailed ophthalmological or optometric exam is recommended. Electronically Signed   By: Denman Fischer M.D.   On: 12/14/2023 07:53   Result Date: 12/14/2023 CLINICAL DATA:  Head and neck trauma.  Fall injury. EXAM: CT HEAD WITHOUT CONTRAST CT CERVICAL SPINE WITHOUT CONTRAST TECHNIQUE: Multidetector CT imaging of the head and cervical spine was performed following the standard protocol without intravenous contrast. Multiplanar CT image reconstructions  of the cervical spine were also generated. RADIATION DOSE REDUCTION: This exam was performed according to the departmental dose-optimization program which includes automated exposure control, adjustment of the mA and/or kV according to patient size and/or use of iterative reconstruction technique. COMPARISON:  CT scan head and cervical spine both 05/29/2023 FINDINGS: CT HEAD FINDINGS Brain: There is moderate to severe cerebral atrophy, atrophic ventriculomegaly, and small-vessel disease with mild cerebellar atrophy. No cortical based evolving infarct, hemorrhage, mass or mass effect are seen. There is no midline shift.  Basal cisterns are clear. Vascular: There are patchy calcifications in both siphons. No hyperdense central vessels. Skull: Osteopenia. Negative for fractures or focal lesions. A metal BB again noted imbedded in the soft tissues of the anterior right cheek. Sinuses/Orbits: No acute finding. Other: None. CT CERVICAL SPINE FINDINGS Alignment: There is chronic slight cervical kyphodextroscoliosis, minimal chronic grade 1 anterolisthesis likely degenerative again noted C3-4, C7-T1, T1-2. Skull base and vertebrae: Patient motion limits fine detail particularly on the sagittal and coronal reformats. No compression fracture is seen or obvious displaced fracture. There is osteopenia. No focal pathologic process is seen through the motion artifacts. Soft tissues and spinal canal: No  prevertebral fluid or swelling. No visible canal hematoma. There is carotid atherosclerosis. No thyroid mass. Disc levels: The discs are diffusely degenerated and collapsed with bidirectional endplate spurring. There is ankylosis across the left facet joints C2-4, multilevel interbody ankylosis via anterior bridging osteophytes. At C4-5, posterior disc osteophyte complex again compresses the left hemicord and the thecal sac is 5.5 mm AP. Other levels do not show spondylotic cord compression, but there is again noted multilevel severe degenerative foraminal stenosis related to old facet hypertrophy, uncinate spurring. This is most severe from C3-4 through C5-6 although is not as well depicted given patient motion. Upper chest: Negative. Other: None. IMPRESSION: 1. No acute intracranial CT findings or depressed skull fractures. 2. Osteopenia and degenerative change of the cervical spine without evidence of fractures or traumatic listhesis. Patient motion limits fine detail. 3. Chronic slight cervical kyphodextroscoliosis and minimal chronic grade 1 anterolisthesis C3-4, C7-T1, T1-2. 4. Multilevel severe degenerative foraminal stenosis. 5. 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 6. Carotid atherosclerosis. 7. Metal BB imbedded in the soft tissues of the anterior right cheek, seen previously. Electronically Signed: By: Denman Fischer M.D. On: 12/14/2023 07:15   DG Lumbar Spine Complete Result Date: 12/14/2023 CLINICAL DATA:  89 year old male status post fall with soft tissue injury, abrasion, skin tear. Pain. EXAM: LUMBAR SPINE - COMPLETE 4+ VIEW COMPARISON:  Lumbar radiographs 11/04/2009. FINDINGS: Aortoiliac calcified atherosclerosis. Nonobstructed visible bowel gas pattern. Tortuous abdominal aorta. Osteopenia. Chronic lumbar spinal ankylosis, with chronic non fusion of what appears to be the T12-L1 level and bulky chronic disc and endplate degeneration there which has progressed since 2011, along with vacuum  disc. Visible lower thoracic levels thin through T12 also appear ankylosed. Maintained vertebral height. No acute osseous abnormality identified. IMPRESSION: 1. 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 No acute osseous abnormality identified. 2.  Aortic Atherosclerosis (ICD10-I70.0). Electronically Signed   By: Marlise Simpers M.D.   On: 12/14/2023 07:31   DG Hips Bilat W or Wo Pelvis 3-4 Views Result Date: 12/14/2023 CLINICAL DATA:  89 year old male status post fall with soft tissue injury, abrasion, skin tear. Pain. Pelvis and right hip series 09/23/2008. EXAM: DG HIP (WITH OR WITHOUT PELVIS) 3-4V BILAT COMPARISON:  None Available. FINDINGS: Five views  at 0649 hours. Tortuous and calcified distal abdominal aorta, aortoiliac bifurcation. Pelvis osteopenia. Femoral heads remain normally located. No pelvis fracture identified. SI joints appear symmetric, pubic symphysis within normal limits. No proximal right femur fracture identified. Proximal femurs appear stable compared to 2010. Nonobstructed bowel-gas pattern. IMPRESSION: 1. Osteopenia. No acute fracture or dislocation identified about the bilateral hips or pelvis. If occult hip fracture is suspected or if the patient is unable to weightbear, MRI is the preferred modality for further evaluation. 2.  Aortic Atherosclerosis (ICD10-I70.0). Electronically Signed   By: Marlise Simpers M.D.   On: 12/14/2023 07:29   DG Shoulder Left Result Date: 12/14/2023 CLINICAL DATA:  89 year old male status post fall with soft tissue injury, abrasion, skin tear. Pain. EXAM: LEFT SHOULDER - 2+ VIEW COMPARISON:  Chest radiographs 06/27/2019. FINDINGS: Four views at 0701 hours. No glenohumeral joint dislocation. Superior subluxation of the left humeral head, but compared to the right side today there is mild to moderate left glenohumeral joint space loss and spurring. Proximal left  humerus appears intact. No fracture of the left clavicle or scapula identified. Left rib osteopenia. No acute fracture of the visible left ribs identified. Negative visible left lung parenchyma. Calcified aortic atherosclerosis. IMPRESSION: No acute fracture or dislocation identified about the left shoulder. Electronically Signed   By: Marlise Simpers M.D.   On: 12/14/2023 07:27   DG Shoulder Right Result Date: 12/14/2023 CLINICAL DATA:  89 year old male status post fall with soft tissue injury, abrasion, skin tear. Pain. EXAM: RIGHT SHOULDER - 2+ VIEW COMPARISON:  Chest radiographs 06/27/2019. FINDINGS: Three views at 0702 hours. Maintained glenohumeral joint alignment with superior subluxation of the humeral head, severe glenohumeral joint space loss, subchondral sclerosis, spurring. Proximal right humerus appears intact. No right clavicle or scapula fracture identified. Visible right ribs and lung appear negative. IMPRESSION: 1. No acute fracture or dislocation identified about the right shoulder. 2. 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 Electronically Signed   By: Marlise Simpers M.D.   On: 12/14/2023 07:26   Data Reviewed: Relevant notes from primary care and specialist visits, past discharge summaries as available in EHR, including Care Everywhere. Prior diagnostic testing as pertinent to current admission diagnoses, Updated medications and problem lists for reconciliation ED course, including vitals, labs, imaging, treatment and response to treatment,Triage notes, nursing and pharmacy notes and ED provider's notes Notable results as noted in HPI.Discussed case with EDMD/ ED APP/ or Specialty MD on call and as needed.  Assessment & Plan  >>Fall / Pain control: PT s/p multiple studies showing -CT lumbar spine without contrast no significant retropulsion no acute traumatic injury disc bulging moderate osseous foraminal stenosis with a redemonstrated fluid cleft at L5-S1, MRI of  the lumbar spine without contrast shows delaying ankylosing disease of the spine please refer to complete report. 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  Admit for pain control.  PT consult.  Suspect patient will be better managed in a skilled nursing facility.  >> Essential hypertension: Currently will hold patient's diltiazem  and HCTZ along with Aldactone  suspect his fall may be due orthostatic changes as his bp here has been fluctuating.   >> Hypothyroidism: Continue levothyroxine at 25 mcg we will check a free T4 and a TSH.   >> Suspect history of BPH: Bladder scan, indwelling Foley as needed.  Continue patient's Flomax  and Proscar .   >> ? Syncope: Dimer/ tni pending.  If  high we will get Echo.   DVT prophylaxis:  Heparin  Consults:  None  Advance Care Planning:    Code Status: Full Code   Family Communication:  None  Disposition Plan:  Home.  Severity of Illness: The appropriate patient status for this patient is OBSERVATION. Observation status is judged to be reasonable and necessary in order to provide the required intensity of service to ensure the patient's safety. The patient's presenting symptoms, physical exam findings, and initial radiographic and laboratory data in the context of their medical condition is felt to place them at decreased risk for further clinical deterioration. Furthermore, it is anticipated that the patient will be medically stable for discharge from the hospital within 2 midnights of admission.   Unresulted Labs (From admission, onward)     Start     Ordered   12/15/23 0500  Comprehensive metabolic panel  Tomorrow morning,   R        12/14/23 1613   12/15/23 0500  CBC  Tomorrow morning,   R        12/14/23 1613   12/14/23 1848  Hepatic function panel  Add-on,   AD        12/14/23 1847            Orders Placed This Encounter   Procedures   DG Shoulder Right   DG Shoulder Left   DG Hips Bilat W or Wo Pelvis 3-4 Views   CT Head Wo Contrast   CT Cervical Spine Wo Contrast   DG Lumbar Spine Complete   MR HIP RIGHT WO CONTRAST   MR LUMBAR SPINE WO CONTRAST   CT Lumbar Spine Wo Contrast   CT Angio Chest Pulmonary Embolism (PE) W or WO Contrast   Basic metabolic panel   CBC with Differential   D-dimer, quantitative   Comprehensive metabolic panel   CBC   Hepatic function panel   DIET - DYS 1 Room service appropriate? Yes with Assist; Fluid consistency: Thin   Ambulate patient   Monitor O2 SATs   Maintain IV access   Vital signs   Notify physician (specify)   Mobility Protocol: No Restrictions RN to initiate protocols based on patient's level of care   Refer to Sidebar Report Refer to ICU, Med-Surg, Progressive, and Step-Down Mobility Protocol Sidebars   Initiate Adult Central Line Maintenance and Catheter Protocol for patients with central line (CVC, PICC, Port, Hemodialysis, Trialysis)   Daily weights   Intake and Output   Do not place and if present remove PureWick   Initiate Oral Care Protocol   Initiate Carrier Fluid Protocol   RN may order General Admission PRN Orders utilizing "General Admission PRN medications" (through manage orders) for the following patient needs: allergy symptoms (Claritin), cold sores (Carmex), cough (Robitussin DM), eye irritation (Liquifilm Tears), hemorrhoids (Tucks), indigestion (Maalox), minor skin irritation (Hydrocortisone Cream), muscle pain Lovena Rubinstein Gay), nose irritation (saline nasal spray) and sore throat (Chloraseptic spray).   Cardiac Monitoring Continuous x 48 hours Indications for use: Other; Other indications for use: ? fall vs syncope.   Swallow screen   Bladder scan   Neuro checks   Full code   Consult to orthopedic surgery   Consult to neurosurgery   Consult to hospitalist   PT eval and treat   Pulse oximetry check with vital signs   Oxygen therapy Mode or  (Route): Nasal cannula; Liters Per Minute: 2; Keep O2 saturation between: greater than 92 %   EKG 12-Lead  EKG 12-Lead   Insert peripheral IV   Saline lock IV   Place in observation (patient's expected length of stay will be less than 2 midnights)   Aspiration precautions   Skin care precautions   Fall precautions    Author: Lavanda Porter, MD 12 pm -8 pm. 12/14/2023 7:02 PM >>Please note for any concern,or critical results after hours past 8pm please contact the [name] floor coverage provider from 7 PM- 7 AM. For on call review [email or web address], username [email or web address] and PW: your phone number<<

## 2023-12-14 NOTE — ED Notes (Signed)
 Patient transported to MRI

## 2023-12-14 NOTE — Progress Notes (Signed)
 Pt transported to CT ?

## 2023-12-14 NOTE — ED Notes (Signed)
 Call daughter for any update. Number is in the chart.

## 2023-12-15 ENCOUNTER — Observation Stay (HOSPITAL_COMMUNITY)

## 2023-12-15 DIAGNOSIS — R55 Syncope and collapse: Secondary | ICD-10-CM

## 2023-12-15 DIAGNOSIS — R262 Difficulty in walking, not elsewhere classified: Secondary | ICD-10-CM | POA: Diagnosis not present

## 2023-12-15 DIAGNOSIS — I10 Essential (primary) hypertension: Secondary | ICD-10-CM | POA: Diagnosis not present

## 2023-12-15 DIAGNOSIS — M7989 Other specified soft tissue disorders: Secondary | ICD-10-CM

## 2023-12-15 DIAGNOSIS — M25511 Pain in right shoulder: Secondary | ICD-10-CM

## 2023-12-15 DIAGNOSIS — W19XXXD Unspecified fall, subsequent encounter: Secondary | ICD-10-CM

## 2023-12-15 DIAGNOSIS — M25512 Pain in left shoulder: Secondary | ICD-10-CM

## 2023-12-15 LAB — COMPREHENSIVE METABOLIC PANEL WITH GFR
ALT: 19 U/L (ref 0–44)
AST: 45 U/L — ABNORMAL HIGH (ref 15–41)
Albumin: 2.7 g/dL — ABNORMAL LOW (ref 3.5–5.0)
Alkaline Phosphatase: 74 U/L (ref 38–126)
Anion gap: 12 (ref 5–15)
BUN: 34 mg/dL — ABNORMAL HIGH (ref 8–23)
CO2: 24 mmol/L (ref 22–32)
Calcium: 8.4 mg/dL — ABNORMAL LOW (ref 8.9–10.3)
Chloride: 102 mmol/L (ref 98–111)
Creatinine, Ser: 1.15 mg/dL (ref 0.61–1.24)
GFR, Estimated: >60 mL/min (ref >60)
Glucose, Bld: 121 mg/dL — ABNORMAL HIGH (ref 70–99)
Potassium: 3.7 mmol/L (ref 3.5–5.1)
Sodium: 138 mmol/L (ref 135–145)
Total Bilirubin: 1.1 mg/dL (ref 0.0–1.2)
Total Protein: 6 g/dL — ABNORMAL LOW (ref 6.5–8.1)

## 2023-12-15 LAB — CBC
HCT: 35.6 % — ABNORMAL LOW (ref 39.0–52.0)
Hemoglobin: 11.5 g/dL — ABNORMAL LOW (ref 13.0–17.0)
MCH: 27.3 pg (ref 26.0–34.0)
MCHC: 32.3 g/dL (ref 30.0–36.0)
MCV: 84.4 fL (ref 80.0–100.0)
Platelets: 205 10*3/uL (ref 150–400)
RBC: 4.22 MIL/uL (ref 4.22–5.81)
RDW: 16.8 % — ABNORMAL HIGH (ref 11.5–15.5)
WBC: 13.7 10*3/uL — ABNORMAL HIGH (ref 4.0–10.5)
nRBC: 0 % (ref 0.0–0.2)

## 2023-12-15 LAB — ECHOCARDIOGRAM COMPLETE
AR max vel: 4.05 cm2
AV Peak grad: 5.8 mmHg
Ao pk vel: 1.2 m/s
S' Lateral: 3.4 cm
Weight: 2906.54 [oz_av]

## 2023-12-15 MED ORDER — SODIUM CHLORIDE 0.9 % IV SOLN: Freq: Once | INTRAVENOUS | Status: AC

## 2023-12-15 NOTE — TOC Initial Note (Addendum)
 Transition of Care [facility]) - Initial/Assessment Note    Patient Details  Name: Gilbert Morse MRN: [id number] Date of Birth: 03/21/1931  Transition of [facility]) CM/SW Contact:    Katelyn Kohlmeyer A [place], LCSW Phone Number: 12/15/2023, 4:07 PM  Clinical Narrative:                  CSW contacted pt's daughter, Devra Fontana to complete assessment as pt is only oriented x1. States pt was living with spouse, from home, requested she be the point of contact as pt's spouse also has health issues. She is agreeable to SNF. CSW completed SNF workup, bed offers pending.  Provided email address to send bed offers when available. [email or web address]  She informed CSW that she is the health care power of attorney and financial power of attorney and will send documents via secure email.    TOC will continue to follow.   Expected Discharge Plan: Skilled Nursing Facility Barriers to Discharge: Continued Medical Work up, SNF Pending bed offer, Insurance Authorization   Patient Goals and CMS Choice            Expected Discharge Plan and Services       Living arrangements for the past 2 months: Single Family Home                                      Prior Living Arrangements/Services Living arrangements for the past 2 months: Single Family Home Lives with:: Spouse          Need for Family Participation in Patient Care: Yes (Comment) Care giver support system in place?: Yes (comment) (pt's daughter, Devra Fontana)      Activities of Daily Living      Permission Sought/Granted                  Emotional Assessment Appearance:: Appears stated age Attitude/Demeanor/Rapport: Unable to Assess Affect (typically observed): Unable to Assess Orientation: : Oriented to Self Alcohol / Substance Use: Not Applicable Psych Involvement: No (comment)  Admission diagnosis:  Fall, initial encounter [W19.XXXA] Ambulatory dysfunction [R26.2] Pain of both hip joints [M25.551, M25.552] Acute pain  of both shoulders [M25.511, M25.512] Closed fracture of sacrum, unspecified portion of sacrum, initial encounter (HCC) [S32.10XA] Patient Active Problem List   Diagnosis Date Noted   Ambulatory dysfunction 12/14/2023   Pressure injury of skin 07/08/2019   Status post total left knee replacement 07/04/2019   S/P total knee arthroplasty, left 07/02/2019   Degenerative arthritis of left knee 06/28/2019   Bilateral impacted cerumen 03/19/2016   Presbycusis of both ears 03/19/2016   PCP:  [name], MD Pharmacy:   [facility] [phone] Jonette Nestle, [place] - [address] AT [facility] [address] [place] [address] Phone: [phone] Fax: [phone]     Social Drivers of Health (SDOH) Social History: SDOH Screenings   Food Insecurity: Low Risk  (12/06/2023)   Received from [facility]  Housing: Low Risk  (12/06/2023)   Received from [facility]  Transportation Needs: No Transportation Needs (12/06/2023)   Received from [facility]  Utilities: Low Risk  (12/06/2023)   Received from [facility]  Tobacco Use: Low Risk  (12/06/2023)   Received from [facility]   SDOH Interventions:     Readmission Risk Interventions     No data to display

## 2023-12-15 NOTE — Progress Notes (Signed)
 [name]                                                                               Gilbert Morse, is a 89 y.o. male, DOB - 06/07/1931, WUJ:[id number] Admit date - 12/14/2023    Outpatient Primary MD for the patient is Gilbert Morse, Gilbert [place], MD  LOS - 0  days    Brief summary   Gilbert Morse is a 89 y.o. male with past medical history  of hearing loss, vitreous hemorrhage,Hypertension, hypothyroidism, BPH, coming from home due to a fall.  Patient brought by EMS.  Patient uses a walker and hurt his left shoulder no reports of loss of consciousness.     Assessment & Plan    Assessment and Plan:  Multiple falls at home as per his daughter.  CT lumbar spine shows Irregularity of the anterior cortex of S1 which may reflect mildly displaced fracture. No significant retropulsion. Neurosurgery  consulted by EDP, recommended WBAT.  Therapy evaluations ordered.  Family is requesting short term rehab. Will wait for therapy evaluations.      Elevated d dimer Venous duplex of lower extremities IS negative for DVT.  Ct angio of the chest is negative for segmental PE.    Hypertension Well controlled.  Holding home BP meds.  Check orthostatic vital signs.     Hypothyroidism:  Resume synthroid .    BPH Continue with flomax .    Syncope CT angio ruled out PE.  Check  Echo.    Estimated body mass index is 23.32 kg/m as calculated from the following:   Height as of 06/02/23: 6\' 2"  (1.88 m).   Weight as of this encounter: 82.4 kg.  Code Status: full code.  DVT Prophylaxis:  heparin  injection 5,000 Units Start: 12/14/23 2200   Level of Care: Level of care: Telemetry Medical Family Communication: Updated patient's daughter over the phone.   Disposition Plan:     Remains inpatient appropriate:  pending therapy evaluation.  Procedures:  None.   Consultants:   Palliative care   Antimicrobials:   Anti-infectives (From admission, onward)    None         Medications  Scheduled Meds:  finasteride   5 mg Oral QHS   heparin   5,000 Units Subcutaneous Q12H   levothyroxine   25 mcg Oral Q0600   sodium chloride  flush  3 mL Intravenous Q12H   tamsulosin   0.4 mg Oral QHS   Continuous Infusions: PRN Meds:.acetaminophen  **OR** acetaminophen , HYDROcodone -acetaminophen , morphine  injection    Subjective:   [name] was seen and examined today.  Confused, appears dehydrated.   Objective:   Vitals:   12/15/23 0435 12/15/23 0500 12/15/23 0854 12/15/23 1300  BP: 111/65  (!) 156/145 128/60  Pulse: 84  76 93  Resp: 14  16 16   Temp: 99.5 F (37.5 C)  97.7 F (36.5 C)   TempSrc: Oral     SpO2: 97%  98% 98%  Weight:  82.4 kg      Intake/Output Summary (Last 24 hours) at 12/15/2023 1512 Last data filed at 12/15/2023 1220 Gross per 24 hour  Intake 383 ml  Output --  Net 383 ml   Filed  Weights   12/15/23 0500  Weight: 82.4 kg     Exam General exam: Appears calm and comfortable  Respiratory system: Clear to auscultation. Respiratory effort normal. Cardiovascular system: S1 & S2 heard, RRR. No JVD, Gastrointestinal system: Abdomen is nondistended, soft and nontender.  Central nervous system: Alert and oriented to person.  Extremities: Symmetric 5 x 5 power. Skin: No rashes,  Psychiatry: pleasantly confused.     Data Reviewed:  I have personally reviewed following labs and imaging studies   CBC Lab Results  Component Value Date   WBC 13.7 (H) 12/15/2023   RBC 4.22 12/15/2023   HGB 11.5 (L) 12/15/2023   HCT 35.6 (L) 12/15/2023   MCV 84.4 12/15/2023   MCH 27.3 12/15/2023   PLT 205 12/15/2023   MCHC 32.3 12/15/2023   RDW 16.8 (H) 12/15/2023   LYMPHSABS 0.4 (L) 12/14/2023   MONOABS 1.3 (H) 12/14/2023   EOSABS 0.0 12/14/2023   BASOSABS 0.1 12/14/2023     Last metabolic panel Lab Results  Component Value Date   NA 138 12/15/2023   K 3.7 12/15/2023   CL 102 12/15/2023   CO2 24 12/15/2023   BUN 34 (H)  12/15/2023   CREATININE 1.15 12/15/2023   GLUCOSE 121 (H) 12/15/2023   GFRNONAA >60 12/15/2023   GFRAA >60 07/03/2019   CALCIUM 8.4 (L) 12/15/2023   PROT 6.0 (L) 12/15/2023   ALBUMIN 2.7 (L) 12/15/2023   BILITOT 1.1 12/15/2023   ALKPHOS 74 12/15/2023   AST 45 (H) 12/15/2023   ALT 19 12/15/2023   ANIONGAP 12 12/15/2023    CBG (last 3)  No results for input(s): "GLUCAP" in the last 72 hours.    Coagulation Profile: No results for input(s): "INR", "PROTIME" in the last 168 hours.   Radiology Studies: DG Swallowing Func-Speech Pathology Result Date: 12/15/2023 Table formatting from the original result was not included. Modified Barium Swallow Study Patient Details Name: Gilbert Morse MRN: [id number] Date of Birth: [birth year] Today's Date: 12/15/2023 HPI/PMH: HPI: Gilbert Morse is a 89 y.o. male with past medical history  of hearing loss, vitreous hemorrhage,Hypertension, hypothyroidism, BPH, coming from home due to a fall.  Patient brought by EMS.  Patient uses a walker and hurt his left shoulder no reports of loss of consciousness.  And did not hit his head per report.  Patient is a limited historian.  Patient is part of the palliative care program with other healthcare and has home health services and physical therapy and home health aide. CT chest: Bibasilar consolidations with multiple calcifications, left greater than right, most likely due to inspissated calcified small airway disease such as due to chronic aspiration. Clinical Impression: Clinical Impression: Pt presents with mild oropharyngeal dysphagia characterized by slightly impaired timing and strength. Anterior hyoid movement and epiglottic inversion are complete but due to mistiming, laryngeal closure is incomplete. This allows quick moving thin liquids to enter the laryngeal vestibule and progress to the level of the vocal folds in trace quantities (PAS 4). Pt senses frank penetration and initiates throat clearance, which is  effective at clearing the majority of already trace penetrates. The amount that is left is expelled by subsequent swallows. Recommend he continue his current diet. SLP will f/u at least briefly to provide education and reinforce use of compensatory strategies. Factors that may increase risk of adverse event in presence of aspiration Roderick Civatte & Jessy Morocco 2021): Factors that may increase risk of adverse event in presence of aspiration Roderick Civatte & Jessy Morocco 2021): Limited mobility;  Frail or deconditioned Recommendations/Plan: Swallowing Evaluation Recommendations Swallowing Evaluation Recommendations Recommendations: PO diet PO Diet Recommendation: Regular; Thin liquids (Level 0) Liquid Administration via: Cup; Straw Medication Administration: Whole meds with puree Supervision: Staff to assist with self-feeding; Full supervision/cueing for swallowing strategies Swallowing strategies  : Minimize environmental distractions; Slow rate; Small bites/sips; Clear throat intermittently Postural changes: Position pt fully upright for meals; Stay upright 30-60 min after meals Oral care recommendations: Oral care BID (2x/day) Treatment Plan Treatment Plan Treatment recommendations: Therapy as outlined in treatment plan below Follow-up recommendations: Skilled nursing-short term rehab (<3 hours/day) Functional status assessment: Patient has had a recent decline in their functional status and demonstrates the ability to make significant improvements in function in a reasonable and predictable amount of time. Treatment frequency: Min 2x/week Treatment duration: 1 week Interventions: Oropharyngeal exercises; Compensatory techniques; Patient/family education; Trials of upgraded texture/liquids; Diet toleration management by SLP Recommendations Recommendations for follow up therapy are one component of a multi-disciplinary discharge planning process, led by the attending physician.  Recommendations may be updated based on patient status,  additional functional criteria and insurance authorization. Assessment: Orofacial Exam: Orofacial Exam Oral Cavity: Oral Hygiene: WFL Oral Cavity - Dentition: Adequate natural dentition Orofacial Anatomy: WFL Oral Motor/Sensory Function: WFL Anatomy: Anatomy: Suspected cervical osteophytes Boluses Administered: Boluses Administered Boluses Administered: Thin liquids (Level 0); Mildly thick liquids (Level 2, nectar thick); Moderately thick liquids (Level 3, honey thick); Puree; Solid  Oral Impairment Domain: Oral Impairment Domain Lip Closure: No labial escape Tongue control during bolus hold: Cohesive bolus between tongue to palatal seal Bolus preparation/mastication: Timely and efficient chewing and mashing Bolus transport/lingual motion: Brisk tongue motion Oral residue: Trace residue lining oral structures Location of oral residue : Tongue Initiation of pharyngeal swallow : Posterior angle of the ramus  Pharyngeal Impairment Domain: Pharyngeal Impairment Domain Soft palate elevation: No bolus between soft palate (SP)/pharyngeal wall (PW) Laryngeal elevation: Complete superior movement of thyroid cartilage with complete approximation of arytenoids to epiglottic petiole Anterior hyoid excursion: Complete anterior movement Epiglottic movement: Complete inversion Laryngeal vestibule closure: Complete, no air/contrast in laryngeal vestibule Pharyngeal stripping wave : Present - complete Pharyngeal contraction (A/P view only): N/A Pharyngoesophageal segment opening: Complete distension and complete duration, no obstruction of flow Tongue base retraction: Trace column of contrast or air between tongue base and PPW Pharyngeal residue: Trace residue within or on pharyngeal structures Location of pharyngeal residue: Tongue base; Valleculae  Esophageal Impairment Domain: No data recorded Pill: No data recorded Penetration/Aspiration Scale Score: Penetration/Aspiration Scale Score 1.  Material does not enter airway: Mildly  thick liquids (Level 2, nectar thick); Moderately thick liquids (Level 3, honey thick); Puree; Solid 5.  Material enters airway, CONTACTS cords and not ejected out: Thin liquids (Level 0) Compensatory Strategies: Compensatory Strategies Compensatory strategies: Yes Straw: Effective; Ineffective Effective Straw: Mildly thick liquid (Level 2, nectar thick) Ineffective Straw: Thin liquid (Level 0) Chin tuck: Effective Effective Chin Tuck: Thin liquid (Level 0)   General Information: Caregiver present: No  Diet Prior to this Study: Regular; Thin liquids (Level 0)   Temperature : Normal   Respiratory Status: WFL   Supplemental O2: None (Room air)   History of Recent Intubation: No  Behavior/Cognition: Alert; Cooperative; Pleasant mood; Confused Self-Feeding Abilities: Able to self-feed Baseline vocal quality/speech: Normal Volitional Cough: Able to elicit Volitional Swallow: Able to elicit Exam Limitations: No limitations Goal Planning: Prognosis for improved oropharyngeal function: Good Barriers to Reach Goals: Cognitive deficits No data recorded Patient/Family Stated Goal: none stated Consulted  and agree with results and recommendations: Patient Pain: Pain Assessment Pain Assessment: Faces Faces Pain Scale: 8 Breathing: 0 Negative Vocalization: 1 Facial Expression: 1 Body Language: 1 Consolability: 0 PAINAD Score: 3 Pain Location: back when transferring Pain Descriptors / Indicators: Aching; Discomfort; Grimacing; Guarding; Moaning Pain Intervention(s): Monitored during session; Repositioned End of Session: Start Time:SLP Start Time (ACUTE ONLY): 1400 Stop Time: SLP Stop Time (ACUTE ONLY): 1412 Time Calculation:SLP Time Calculation (min) (ACUTE ONLY): 12 min Charges: SLP Evaluations $ SLP Speech Visit: 1 Visit SLP Evaluations $BSS Swallow: 1 Procedure $MBS Swallow: 1 Procedure SLP visit diagnosis: SLP Visit Diagnosis: Dysphagia, oropharyngeal phase (R13.12) Past Medical History: Past Medical History: Diagnosis Date   Arthritis   Enlarged prostate   Falls 06/27/2019  last fall was 2 weeks ago   Heart murmur   History of kidney stones   only one as a young man , passed independently   HTN (hypertension)  Past Surgical History: Past Surgical History: Procedure Laterality Date  BACK SURGERY    total 6 surgeries  . inluding discectomy, fusion with harvest from right hip", rods in place   TOTAL KNEE ARTHROPLASTY Left 07/02/2019  Procedure: Left Knee Arthroplasty;  Surgeon: Wendolyn Hamburger, MD;  Location: [facility];  Service: Orthopedics;  Laterality: Left; Amil Kale, M.A., CCC-SLP Speech Language Pathology, Acute Rehabilitation Services Secure Chat preferred [phone] 12/15/2023, 2:45 PM  VAS US  LOWER EXTREMITY VENOUS (DVT) Result Date: 12/15/2023  Lower Venous DVT Study Patient Name:  MACRAE DEBAUN  Date of Exam:   12/15/2023 Medical Rec #: [id number]         Accession #:    [id number] Date of Birth: 07-Dec-1930          Patient Gender: M Patient Age:   54 years Exam Location[facility]  [facility] Procedure:      VAS US  LOWER EXTREMITY VENOUS (DVT) Referring Phys: [name] --------------------------------------------------------------------------------  Indications: Swelling, Pain, Edema, and Patient had a recent fall.  Comparison Study: 05-17-19 - Negative Performing Technologist: Franky Ivanoff Sturdivant-Jones RDMS, RVT  Examination Guidelines: A complete evaluation includes B-mode imaging, spectral Doppler, color Doppler, and power Doppler as needed of all accessible portions of each vessel. Bilateral testing is considered an integral part of a complete examination. Limited examinations for reoccurring indications may be performed as noted. The reflux portion of the exam is performed with the patient in reverse Trendelenburg.  +---------+---------------+---------+-----------+----------+--------------+ RIGHT    CompressibilityPhasicitySpontaneityPropertiesThrombus Aging  +---------+---------------+---------+-----------+----------+--------------+ CFV      Full           Yes      Yes                                 +---------+---------------+---------+-----------+----------+--------------+ SFJ      Full                                                        +---------+---------------+---------+-----------+----------+--------------+ FV Prox  Full                                                        +---------+---------------+---------+-----------+----------+--------------+  FV Mid   Full                                                        +---------+---------------+---------+-----------+----------+--------------+ FV DistalFull                                                        +---------+---------------+---------+-----------+----------+--------------+ PFV      Full                                                        +---------+---------------+---------+-----------+----------+--------------+ POP      Full           Yes      Yes                                 +---------+---------------+---------+-----------+----------+--------------+ PTV      Full                                                        +---------+---------------+---------+-----------+----------+--------------+ PERO     Full                                                        +---------+---------------+---------+-----------+----------+--------------+   +---------+---------------+---------+-----------+----------+--------------+ LEFT     CompressibilityPhasicitySpontaneityPropertiesThrombus Aging +---------+---------------+---------+-----------+----------+--------------+ CFV      Full           Yes      Yes                                 +---------+---------------+---------+-----------+----------+--------------+ SFJ      Full                                                         +---------+---------------+---------+-----------+----------+--------------+ FV Prox  Full                                                        +---------+---------------+---------+-----------+----------+--------------+ FV Mid   Full                                                        +---------+---------------+---------+-----------+----------+--------------+  FV DistalFull                                                        +---------+---------------+---------+-----------+----------+--------------+ PFV      Full                                                        +---------+---------------+---------+-----------+----------+--------------+ POP      Full           Yes                                          +---------+---------------+---------+-----------+----------+--------------+ PTV      Full                                                        +---------+---------------+---------+-----------+----------+--------------+ PERO     Full                                                        +---------+---------------+---------+-----------+----------+--------------+     Summary: BILATERAL: - No evidence of deep vein thrombosis seen in the lower extremities, bilaterally. -No evidence of popliteal cyst, bilaterally.   *See table(s) above for measurements and observations.    Preliminary    CT Angio Chest Pulmonary Embolism (PE) W or WO Contrast Result Date: 12/14/2023 CLINICAL DATA:  Fall injury earlier today, now with suspected pulmonary embolism, high probability. There is ribcage pain, hypertension, and abnormal D-dimer. EXAM: CT ANGIOGRAPHY CHEST WITH CONTRAST TECHNIQUE: Multidetector CT imaging of the chest was performed using the standard protocol during bolus administration of intravenous contrast. Multiplanar CT image reconstructions and MIPs were obtained to evaluate the vascular anatomy. RADIATION DOSE REDUCTION: This exam was performed according to  the departmental dose-optimization program which includes automated exposure control, adjustment of the mA and/or kV according to patient size and/or use of iterative reconstruction technique. CONTRAST:  65mL OMNIPAQUE  IOHEXOL  350 MG/ML SOLN COMPARISON:  Last chest x-ray was PA and lateral 06/27/2019. No prior cross-sectional imaging for comparison. He had a lumbar spine CT earlier today but this did not include the chest. FINDINGS: Cardiovascular: There is mild panchamber cardiomegaly. No evidence of right heart strain. There are left main and patchy three-vessel coronary artery calcifications and a small pericardial effusion anteriorly. Pulmonary arteries are adequately opacified and free of thrombus at least to the segmental level. The subsegmental arteries are largely obscured by breathing motion and not well seen. Subsegmental emboli could be missed. The the aorta is tortuous with moderate patchy calcific plaques, scattered calcification in the great vessels. There is no aneurysm, stenosis or dissection.  No venous dilatation. Mediastinum/Nodes: There is a mildly patulous esophagus with normal wall thickness. Query chronic dysmotility. The thoracic trachea  and both main bronchi are patent. No thyroid or axillary mass is seen. There is no intrathoracic adenopathy. Lungs/Pleura: There are bilateral layering trace pleural effusions. No pneumothorax or hemothorax. There is a small posterior basal consolidation in the left lower lobe with multiple punctate and curvilinear calcifications within the consolidation, extending medially adjacent the left hemidiaphragm. This is most likely chronic consolidation due to inspissated calcified small airway disease such as due to chronic aspiration. superimposed active infection is not strictly excluded. This is further supported by the observation of volume loss in the left lower lobe base. There is similar atelectasis with calcifications although over a smaller area, in the  right lower lobe base. No focal consolidations are noted elsewhere. There is a 5 mm nodule anteriorly in the base of the right upper lobe on 7:60. There are no other appreciable nodules. Upper Abdomen: Moderate hepatic steatosis. Scattered punctate nonobstructive caliceal stones upper pole left kidney. No acute upper abdominal findings. Abdominal aortic atherosclerosis Musculoskeletal: There are bridging syndesmophytes and bridging enthesopathy throughout the thoracic spine, osteopenia, multilevel collapsed and ankylosed disc spaces. No acute compression fracture is seen. The ribcage is grossly intact. There is moderate bilateral gynecomastia with no other focal chest wall abnormality. There is moderately advanced bilateral shoulder arthrosis, and on the left there is acromiohumeral abutment consistent with chronic rotator cuff arthropathy with a likely degenerative tear. Review of the MIP images confirms the above findings. IMPRESSION: 1. No evidence of arterial thrombosis at least to the segmental level. The subsegmental arteries are largely obscured by breathing motion and not well seen. Subsegmental emboli could be missed. 2. Cardiomegaly with small pericardial effusion. 3. Aortic and coronary artery atherosclerosis. 4. Trace bilateral pleural effusions. 5. Bibasilar consolidations with multiple calcifications, left greater than right, most likely due to inspissated calcified small airway disease such as due to chronic aspiration. Superimposed active infection is not strictly excluded. No consolidation is seen elsewhere. 6. 5 mm nodule anteriorly in the base of the right upper lobe. Per Fleischner Society Guidelines, if patient is low risk for malignancy, no routine follow-up imaging is recommended. If patient is high risk for malignancy, a non-contrast Chest CT at 12 months is optional. If performed and the nodule is stable at 12 months, no further follow-up is recommended. These guidelines do not apply to  immunocompromised patients and patients with cancer. Follow up in patients with significant comorbidities as clinically warranted. For lung cancer screening, adhere to Lung-RADS guidelines. Reference: Radiology. 2017; 284(1):228-43. 7. Hepatic steatosis. 8. Nonobstructive left micronephrolithiasis. 9. Osteopenia and degenerative change. No acute compression fracture is seen. 10. Gynecomastia. Aortic Atherosclerosis (ICD10-I70.0). Electronically Signed   By: Denman Fischer M.D.   On: 12/14/2023 21:42   CT Lumbar Spine Wo Contrast Result Date: 12/14/2023 CLINICAL DATA:  Lumbar radiculopathy, fall with severe lower back pain. EXAM: CT LUMBAR SPINE WITHOUT CONTRAST TECHNIQUE: Multidetector CT imaging of the lumbar spine was performed without intravenous contrast administration. Multiplanar CT image reconstructions were also generated. RADIATION DOSE REDUCTION: This exam was performed according to the departmental dose-optimization program which includes automated exposure control, adjustment of the mA and/or kV according to patient size and/or use of iterative reconstruction technique. COMPARISON:  MRI lumbar spine 12/14/2023. FINDINGS: Segmentation: 5 lumbar type vertebrae. Alignment: Straightening of the normal lumbar lordosis. Trace retrolisthesis of L5 relative to S1. Widening of the L5-S1 disc space corresponding to findings of large fluid-filled cleft on MRI. Vertebrae: Ankylosis at the T11-T12 level. Additional ankylosis from L1-2 L5. T12-L1 level remains  mobile. Diffuse osteopenia. No evidence of compression fracture or displaced fracture in the lumbar spine. Limited evaluation for nondisplaced fracture given the degree of osteopenia. There is slight irregularity along the anterior cortex of S1 which may reflect mildly displaced fracture. Ankylosis of the bilateral sacroiliac joints. Paraspinal and other soft tissues: Atrophy of the paraspinal musculature. The visualized paraspinal soft tissues are otherwise  unremarkable. Moderate atherosclerosis of the abdominal aorta and branch vessels. Moderately distended urinary bladder with irregular contour which may reflect multiple bladder diverticuli. Disc levels: Moderate disc space narrowing at T12-L1 with degenerative endplate changes and prominent endplate osteophytes. Disc bulge and posterior osteophytes along with facet arthrosis resulting in moderate spinal canal stenosis. There is mild-to-moderate osseous foraminal stenosis on the right. Additional disc bulges and posterior osteophytes at multiple levels in the lumbar spine from L1-2 to L4-5 without high-grade osseous spinal canal stenosis. No high-grade osseous foraminal stenosis. No evidence of high-grade stenosis at L5-S1. Prominent facet overgrowth on the right at L5-S1 resulting in moderate foraminal narrowing. IMPRESSION: Irregularity of the anterior cortex of S1 which may reflect mildly displaced fracture. No significant retropulsion. No evidence of acute traumatic injury at T12-L1. Degenerative changes of the lumbar spine as above. Disc bulge and prominent osteophytes at T12-L1 resulting in moderate osseous spinal canal stenosis. Moderate osseous foraminal stenosis on the right at L5-S1. Redemonstrated fluid cleft at the L5-S1 disc space which is better evaluated on same day MRI. Moderately distended urinary bladder with irregular contour which may reflect multiple bladder diverticuli. Electronically Signed   By: Denny Flack M.D.   On: 12/14/2023 12:27   MR LUMBAR SPINE WO CONTRAST Result Date: 12/14/2023 CLINICAL DATA:  Ataxia. Fell. Low back pain and bilateral hip pain. EXAM: MRI LUMBAR SPINE WITHOUT CONTRAST TECHNIQUE: Multiplanar, multisequence MR imaging of the lumbar spine was performed. No intravenous contrast was administered. COMPARISON:  Radiography same day. MRI 10/16/2008. CT pelvis 09/23/2008. FINDINGS: Segmentation: 5 lumbar type vertebral bodies as numbered previously. Alignment:  No  malalignment presently. Vertebrae: There is ankylosis of the spine in the lower thoracic region from inferior T10 through T12. T12-L1 remains a mobile level. There is ankylosis from L1 through L4. There is a large fluid-filled cleft at the L5 level the relates in some way to an old fracture at the L5 level. Chronic and possibly acute fracture lines extend through the posterior elements at this level. Present I think many of the findings relate to the old fracture with chronic nonunion, I cannot exclude an element of acute injury at this level and think that a lumbar CT scan would be useful to correlate with this exam. Conus medullaris and cauda equina: Conus extends to the T12-L1 level. See below regarding stenosis at this level. Conus and cauda equina appear otherwise normal. Paraspinal and other soft tissues: Negative Disc levels: T12-L1: This remains a mobile level. There is disc degeneration with loss of disc height. There are circumferential endplate osteophytes. The facets show arthropathy with facet and ligamentous hypertrophy. There is spinal stenosis at this level that could have compressive effect upon the conus tip and nerve roots of the cauda equina. I do not see an acute traumatic finding at this level but would suggest this be evaluated with CT. L1 through L4: Chronic ankylosis with sufficient patency of the canal and foramina. At L5, there is a large fluid-filled cleft measuring up to 2.3 cm in height. I suspect that this is chronic sequela of an old nonunited fracture at L5. There appears  to be chronic extension through the posterior elements at this level in there could be ongoing motion. I would recommend a CT scan of this area as it is impossible to rule out some acute injury superimposed on what are probably largely chronic findings. IMPRESSION: 1. Underlying ankylosing disease of the spine. 2. T12-L1 remains a mobile level. There is disc degeneration with loss of disc height and circumferential  endplate osteophytes. There is facet and ligamentous hypertrophy. There is spinal stenosis at this level that could have compressive effect upon the conus tip and nerve roots of the cauda equina. I do not see an acute traumatic finding at this level but would suggest this be evaluated with CT. 3. At L5, there is a large fluid-filled cleft measuring up to 2.3 cm in height. I suspect that this is chronic sequela of an old nonunited fracture at L5. There appears to be chronic extension through the posterior elements at this level and there could be ongoing motion. I would recommend a CT scan of this area as it is impossible to exclude some acute injury superimposed on what are probably largely chronic findings. Electronically Signed   By: Bettylou Brunner M.D.   On: 12/14/2023 11:01   MR HIP RIGHT WO CONTRAST Result Date: 12/14/2023 CLINICAL DATA:  Fall. EXAM: MR OF THE RIGHT HIP WITHOUT CONTRAST TECHNIQUE: Multiplanar, multisequence MR imaging was performed. No intravenous contrast was administered. COMPARISON:  Hip radiographs dated 12/14/2023 at 7:08 a.m. FINDINGS: Examination is significantly limited due to motion degradation. Soft tissue and Muscle: There is no significant focal soft tissue swelling.Hamstring tendon origins are intact.Gluteal cuff tendon insertions are intact.Moderate-to-large diverticulum at the right superior bladder dome with additional smaller scattered diverticula and trabecular appearance of the bladder. Bones: No acute osseous abnormality of the pelvis or bilateral proximal femora. The bone marrow signal intensity is within normal limits. Partially evaluated ankylosis of the lumbar spine with cleft like defect again noted at the L5 level, likely related to sequela of prior L5 fracture. Please refer to the same-day MRI of the lumbar spine for description of lumbar spine findings. Hip: The hip joint is anatomically aligned. Mild superolateral joint space narrowing and marginal  osteophytosis.The labrum is grossly intact, although evaluation is limited by lack of intra-articular fluid/contrast.No hip joint effusion. Ligamentum teres and transverse ligaments are intact. IMPRESSION: Evaluation is limited due to motion degradation. 1. No acute osseous abnormality. 2. Partially evaluated ankylosis of the lumbar spine with cleft like defect again noted at the L5 level, likely related to sequela of prior L5 fracture. Please refer to the same-day MRI of the lumbar spine for description of lumbar spine findings. Electronically Signed   By: Mannie Seek M.D.   On: 12/14/2023 11:00   CT Head Wo Contrast Addendum Date: 12/14/2023 ADDENDUM REPORT: 12/14/2023 07:53 ADDENDUM: Not mentioned above, there is soft tissue thickening right of the midline along right ocular retinal layer. This was seen previously but clinical significance indeterminate despite lack of interval change. This could simply be under old retinal hemorrhage but other etiologies are possible. Detailed ophthalmological or optometric exam is recommended. Electronically Signed   By: Denman Fischer M.D.   On: 12/14/2023 07:53   Result Date: 12/14/2023 CLINICAL DATA:  Head and neck trauma.  Fall injury. EXAM: CT HEAD WITHOUT CONTRAST CT CERVICAL SPINE WITHOUT CONTRAST TECHNIQUE: Multidetector CT imaging of the head and cervical spine was performed following the standard protocol without intravenous contrast. Multiplanar CT image reconstructions of the cervical spine  were also generated. RADIATION DOSE REDUCTION: This exam was performed according to the departmental dose-optimization program which includes automated exposure control, adjustment of the mA and/or kV according to patient size and/or use of iterative reconstruction technique. COMPARISON:  CT scan head and cervical spine both 05/29/2023 FINDINGS: CT HEAD FINDINGS Brain: There is moderate to severe cerebral atrophy, atrophic ventriculomegaly, and small-vessel disease with  mild cerebellar atrophy. No cortical based evolving infarct, hemorrhage, mass or mass effect are seen. There is no midline shift.  Basal cisterns are clear. Vascular: There are patchy calcifications in both siphons. No hyperdense central vessels. Skull: Osteopenia. Negative for fractures or focal lesions. A metal BB again noted imbedded in the soft tissues of the anterior right cheek. Sinuses/Orbits: No acute finding. Other: None. CT CERVICAL SPINE FINDINGS Alignment: There is chronic slight cervical kyphodextroscoliosis, minimal chronic grade 1 anterolisthesis likely degenerative again noted C3-4, C7-T1, T1-2. Skull base and vertebrae: Patient motion limits fine detail particularly on the sagittal and coronal reformats. No compression fracture is seen or obvious displaced fracture. There is osteopenia. No focal pathologic process is seen through the motion artifacts. Soft tissues and spinal canal: No prevertebral fluid or swelling. No visible canal hematoma. There is carotid atherosclerosis. No thyroid mass. Disc levels: The discs are diffusely degenerated and collapsed with bidirectional endplate spurring. There is ankylosis across the left facet joints C2-4, multilevel interbody ankylosis via anterior bridging osteophytes. At C4-5, posterior disc osteophyte complex again compresses the left hemicord and the thecal sac is 5.5 mm AP. Other levels do not show spondylotic cord compression, but there is again noted multilevel severe degenerative foraminal stenosis related to old facet hypertrophy, uncinate spurring. This is most severe from C3-4 through C5-6 although is not as well depicted given patient motion. Upper chest: Negative. Other: None. IMPRESSION: 1. No acute intracranial CT findings or depressed skull fractures. 2. Osteopenia and degenerative change of the cervical spine without evidence of fractures or traumatic listhesis. Patient motion limits fine detail. 3. Chronic slight cervical kyphodextroscoliosis  and minimal chronic grade 1 anterolisthesis C3-4, C7-T1, T1-2. 4. Multilevel severe degenerative foraminal stenosis. 5. 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 6. Carotid atherosclerosis. 7. Metal BB imbedded in the soft tissues of the anterior right cheek, seen previously. Electronically Signed: By: Denman Fischer M.D. On: 12/14/2023 07:15   CT Cervical Spine Wo Contrast Addendum Date: 12/14/2023 ADDENDUM REPORT: 12/14/2023 07:53 ADDENDUM: Not mentioned above, there is soft tissue thickening right of the midline along right ocular retinal layer. This was seen previously but clinical significance indeterminate despite lack of interval change. This could simply be under old retinal hemorrhage but other etiologies are possible. Detailed ophthalmological or optometric exam is recommended. Electronically Signed   By: Denman Fischer M.D.   On: 12/14/2023 07:53   Result Date: 12/14/2023 CLINICAL DATA:  Head and neck trauma.  Fall injury. EXAM: CT HEAD WITHOUT CONTRAST CT CERVICAL SPINE WITHOUT CONTRAST TECHNIQUE: Multidetector CT imaging of the head and cervical spine was performed following the standard protocol without intravenous contrast. Multiplanar CT image reconstructions of the cervical spine were also generated. RADIATION DOSE REDUCTION: This exam was performed according to the departmental dose-optimization program which includes automated exposure control, adjustment of the mA and/or kV according to patient size and/or use of iterative reconstruction technique. COMPARISON:  CT scan head and cervical spine both 05/29/2023 FINDINGS: CT HEAD FINDINGS Brain: There is moderate to severe cerebral atrophy, atrophic ventriculomegaly, and small-vessel disease with mild cerebellar atrophy. No cortical based evolving infarct,  hemorrhage, mass or mass effect are seen. There is no midline shift.  Basal cisterns are clear. Vascular: There are patchy calcifications in both siphons. No hyperdense central vessels.  Skull: Osteopenia. Negative for fractures or focal lesions. A metal BB again noted imbedded in the soft tissues of the anterior right cheek. Sinuses/Orbits: No acute finding. Other: None. CT CERVICAL SPINE FINDINGS Alignment: There is chronic slight cervical kyphodextroscoliosis, minimal chronic grade 1 anterolisthesis likely degenerative again noted C3-4, C7-T1, T1-2. Skull base and vertebrae: Patient motion limits fine detail particularly on the sagittal and coronal reformats. No compression fracture is seen or obvious displaced fracture. There is osteopenia. No focal pathologic process is seen through the motion artifacts. Soft tissues and spinal canal: No prevertebral fluid or swelling. No visible canal hematoma. There is carotid atherosclerosis. No thyroid mass. Disc levels: The discs are diffusely degenerated and collapsed with bidirectional endplate spurring. There is ankylosis across the left facet joints C2-4, multilevel interbody ankylosis via anterior bridging osteophytes. At C4-5, posterior disc osteophyte complex again compresses the left hemicord and the thecal sac is 5.5 mm AP. Other levels do not show spondylotic cord compression, but there is again noted multilevel severe degenerative foraminal stenosis related to old facet hypertrophy, uncinate spurring. This is most severe from C3-4 through C5-6 although is not as well depicted given patient motion. Upper chest: Negative. Other: None. IMPRESSION: 1. No acute intracranial CT findings or depressed skull fractures. 2. Osteopenia and degenerative change of the cervical spine without evidence of fractures or traumatic listhesis. Patient motion limits fine detail. 3. Chronic slight cervical kyphodextroscoliosis and minimal chronic grade 1 anterolisthesis C3-4, C7-T1, T1-2. 4. Multilevel severe degenerative foraminal stenosis. 5. 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 6. Carotid atherosclerosis. 7. Metal BB imbedded in the soft tissues of the  anterior right cheek, seen previously. Electronically Signed: By: Denman Fischer M.D. On: 12/14/2023 07:15   DG Lumbar Spine Complete Result Date: 12/14/2023 CLINICAL DATA:  89 year old male status post fall with soft tissue injury, abrasion, skin tear. Pain. EXAM: LUMBAR SPINE - COMPLETE 4+ VIEW COMPARISON:  Lumbar radiographs 11/04/2009. FINDINGS: Aortoiliac calcified atherosclerosis. Nonobstructed visible bowel gas pattern. Tortuous abdominal aorta. Osteopenia. Chronic lumbar spinal ankylosis, with chronic non fusion of what appears to be the T12-L1 level and bulky chronic disc and endplate degeneration there which has progressed since 2011, along with vacuum disc. Visible lower thoracic levels thin through T12 also appear ankylosed. Maintained vertebral height. No acute osseous abnormality identified. IMPRESSION: 1. 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 No acute osseous abnormality identified. 2.  Aortic Atherosclerosis (ICD10-I70.0). Electronically Signed   By: Marlise Simpers M.D.   On: 12/14/2023 07:31   DG Hips Bilat W or Wo Pelvis 3-4 Views Result Date: 12/14/2023 CLINICAL DATA:  89 year old male status post fall with soft tissue injury, abrasion, skin tear. Pain. Pelvis and right hip series 09/23/2008. EXAM: DG HIP (WITH OR WITHOUT PELVIS) 3-4V BILAT COMPARISON:  None Available. FINDINGS: Five views at 0649 hours. Tortuous and calcified distal abdominal aorta, aortoiliac bifurcation. Pelvis osteopenia. Femoral heads remain normally located. No pelvis fracture identified. SI joints appear symmetric, pubic symphysis within normal limits. No proximal right femur fracture identified. Proximal femurs appear stable compared to 2010. Nonobstructed bowel-gas pattern. IMPRESSION: 1. Osteopenia. No acute fracture or dislocation identified about the bilateral hips or pelvis. If occult hip fracture is  suspected or if the patient is unable to weightbear,  MRI is the preferred modality for further evaluation. 2.  Aortic Atherosclerosis (ICD10-I70.0). Electronically Signed   By: Marlise Simpers M.D.   On: 12/14/2023 07:29   DG Shoulder Left Result Date: 12/14/2023 CLINICAL DATA:  89 year old male status post fall with soft tissue injury, abrasion, skin tear. Pain. EXAM: LEFT SHOULDER - 2+ VIEW COMPARISON:  Chest radiographs 06/27/2019. FINDINGS: Four views at 0701 hours. No glenohumeral joint dislocation. Superior subluxation of the left humeral head, but compared to the right side today there is mild to moderate left glenohumeral joint space loss and spurring. Proximal left humerus appears intact. No fracture of the left clavicle or scapula identified. Left rib osteopenia. No acute fracture of the visible left ribs identified. Negative visible left lung parenchyma. Calcified aortic atherosclerosis. IMPRESSION: No acute fracture or dislocation identified about the left shoulder. Electronically Signed   By: Marlise Simpers M.D.   On: 12/14/2023 07:27   DG Shoulder Right Result Date: 12/14/2023 CLINICAL DATA:  89 year old male status post fall with soft tissue injury, abrasion, skin tear. Pain. EXAM: RIGHT SHOULDER - 2+ VIEW COMPARISON:  Chest radiographs 06/27/2019. FINDINGS: Three views at 0702 hours. Maintained glenohumeral joint alignment with superior subluxation of the humeral head, severe glenohumeral joint space loss, subchondral sclerosis, spurring. Proximal right humerus appears intact. No right clavicle or scapula fracture identified. Visible right ribs and lung appear negative. IMPRESSION: 1. No acute fracture or dislocation identified about the right shoulder. 2. 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 Electronically Signed   By: Marlise Simpers M.D.   On: 12/14/2023 07:26       [name] M.D. [name] 12/15/2023, 3:12 PM  Available via Epic secure chat 7am-7pm After 7  pm, please refer to night coverage provider listed on amion.

## 2023-12-15 NOTE — Evaluation (Signed)
 Physical Therapy Evaluation Patient Details Name: Gilbert Morse MRN: [id number] DOB: 01-05-1931 Today's Date: 12/15/2023  History of Present Illness  89 y.o. male admitted 12/14/23 after fall at home with elevated Ddimer and no fx. PE and DVT (-). PMhx: hearing loss, HTN, hypothyroidism, BPH, Lt TKA  Clinical Impression  Pt pleasant on arrival though [facility] and with limited vision. Pt oriented to self. Pt with progressive decline in function at home with fall, shuffling gait and max 25' gait on a regular basis. Wife struggling to care for pt with assist of PCA. Pt currently resistant to all attempts at mobility out of bed from rolling to attempt to pivot to sitting. Pt will benefit from trial of acute therapy to assess further willingness and ability to actively participate in therapy. Family unable to care for pt at home and will required skilled care.         If plan is discharge home, recommend the following: Two people to help with walking and/or transfers;Two people to help with bathing/dressing/bathroom;Assist for transportation;Assistance with cooking/housework;Direct supervision/assist for financial management;Assistance with feeding;Help with stairs or ramp for entrance;Direct supervision/assist for medications management   Can travel by private vehicle   No    Equipment Recommendations Hospital bed;Hoyer lift  Recommendations for Other Services       Functional Status Assessment Patient has had a recent decline in their functional status and/or demonstrates limited ability to make significant improvements in function in a reasonable and predictable amount of time     Precautions / Restrictions Precautions Precautions: Fall;Other (comment) Recall of Precautions/Restrictions: Impaired Precaution/Restrictions Comments: incontinent      Mobility  Bed Mobility Overal bed mobility: Needs Assistance Bed Mobility: Rolling Rolling: Max assist         General bed mobility  comments: max assist to roll to left, max assist to initiate swinging legs to EOB and attempt to elevate trunk but pt resistant and pushing against therapist with repeated attempts.Total +2 to slide toward [facility] and position in semichair for swallowing    Transfers                   General transfer comment: unable    Ambulation/Gait                  Stairs            Wheelchair Mobility     Tilt Bed    Modified Rankin (Stroke Patients Only)       Balance                                             Pertinent Vitals/Pain Pain Assessment Pain Assessment: PAINAD Breathing: normal Negative Vocalization: occasional moan/groan, low speech, negative/disapproving quality Facial Expression: sad, frightened, frown Body Language: tense, distressed pacing, fidgeting Consolability: no need to console PAINAD Score: 3 Pain Intervention(s): Repositioned, Limited activity within patient's tolerance    Home Living Family/patient expects to be discharged to:: Private residence Living Arrangements: Spouse/significant other Available Help at Discharge: Family;Available 24 hours/day;Personal care attendant Type of Home: House Home Access: Stairs to enter   Entrance Stairs-Number of Steps: 4   Home Layout: One level Home Equipment: Agricultural consultant (2 wheels);Cane - single point;Transport chair;BSC/3in1;Wheelchair - manual      Prior Function Prior Level of Function : Needs assist  Mobility Comments: walks grossly 42' with RW and shuffling gait, assist for stairs usually with medical transport chair ADLs Comments: assist for ADLs, wears adult brief     Extremity/Trunk Assessment   Upper Extremity Assessment Upper Extremity Assessment: Generalized weakness    Lower Extremity Assessment Lower Extremity Assessment: Generalized weakness       Communication   Communication Communication: Impaired Factors Affecting  Communication: Hearing impaired    Cognition Arousal: Alert Behavior During Therapy: Agitated   PT - Cognitive impairments: History of cognitive impairments                       PT - Cognition Comments: pt able to respond to name and birthday, unaware of step daughter in room, not following commands for mobility and resistant to movement Following commands: Impaired       Cueing Cueing Techniques: Verbal cues, Gestural cues, Tactile cues     General Comments      Exercises     Assessment/Plan    PT Assessment Patient needs continued PT services  PT Problem List Decreased strength;Decreased coordination;Decreased cognition;Decreased activity tolerance;Decreased balance;Decreased mobility;Decreased safety awareness;Decreased knowledge of use of DME       PT Treatment Interventions Functional mobility training;Therapeutic activities;Therapeutic exercise;Patient/family education;Cognitive remediation;Neuromuscular re-education;Gait training;DME instruction;Balance training    PT Goals (Current goals can be found in the Care Plan section)  Acute Rehab PT Goals Patient Stated Goal: go home PT Goal Formulation: With patient/family Time For Goal Achievement: 12/29/23 Potential to Achieve Goals: Poor    Frequency Min 1X/week     Co-evaluation               AM-PAC PT "6 Clicks" Mobility  Outcome Measure Help needed turning from your back to your side while in a flat bed without using bedrails?: Total Help needed moving from lying on your back to sitting on the side of a flat bed without using bedrails?: Total Help needed moving to and from a bed to a chair (including a wheelchair)?: Total Help needed standing up from a chair using your arms (e.g., wheelchair or bedside chair)?: Total Help needed to walk in hospital room?: Total Help needed climbing 3-5 steps with a railing? : Total 6 Click Score: 6    End of Session   Activity Tolerance: Treatment limited  secondary to agitation Patient left: in bed;with call bell/phone within reach;with family/visitor present;with nursing/sitter in room;with bed alarm set Nurse Communication: Mobility status;Need for lift equipment PT Visit Diagnosis: Other abnormalities of gait and mobility (R26.89);Difficulty in walking, not elsewhere classified (R26.2);Muscle weakness (generalized) (M62.81);History of falling (Z91.81)    Time: 1610-9604 PT Time Calculation (min) (ACUTE ONLY): 19 min   Charges:   PT Evaluation $PT Eval Moderate Complexity: 1 Mod   PT General Charges $$ ACUTE PT VISIT: 1 Visit         Annis Baseman, PT Acute Rehabilitation Services Office: [phone]   Gilbert Morse 12/15/2023, 11:39 AM

## 2023-12-15 NOTE — Progress Notes (Signed)
 Venous duplex lower ext  has been completed. Refer to [facility] under chart review to view preliminary results.   12/15/2023  10:27 AM Gilbert Morse, Hollace Lund

## 2023-12-15 NOTE — Progress Notes (Signed)
 Modified Barium Swallow Study  Patient Details  Name: Gilbert Morse MRN: [id number] Date of Birth: Feb 19, 1931  Today's Date: 12/15/2023  Modified Barium Swallow completed.  Full report located under Chart Review in the Imaging Section.  History of Present Illness Gilbert Morse is a 89 y.o. male with past medical history  of hearing loss, vitreous hemorrhage,Hypertension, hypothyroidism, BPH, coming from home due to a fall.  Patient brought by EMS.  Patient uses a walker and hurt his left shoulder no reports of loss of consciousness.  And did not hit his head per report.  Patient is a limited historian.  Patient is part of the palliative care program with other healthcare and has home health services and physical therapy and home health aide. CT chest: Bibasilar consolidations with multiple calcifications, left greater than right, most likely due to inspissated calcified small airway disease such as due to chronic aspiration.   Clinical Impression Pt presents with mild oropharyngeal dysphagia characterized by slightly impaired timing and strength. Anterior hyoid movement and epiglottic inversion are complete but due to mistiming, laryngeal closure is incomplete. This allows quick moving thin liquids to enter the laryngeal vestibule and progress to the level of the vocal folds in trace quantities (PAS 4). Pt senses frank penetration and initiates throat clearance, which is effective at clearing the majority of already trace penetrates. The amount that is left is expelled by subsequent swallows. Recommend he continue his current diet. SLP will f/u at least briefly to provide education and reinforce use of compensatory strategies. Factors that may increase risk of adverse event in presence of aspiration Roderick Civatte & Jessy Morocco 2021): Limited mobility;Frail or deconditioned  Swallow Evaluation Recommendations Recommendations: PO diet PO Diet Recommendation: Regular;Thin liquids (Level 0) Liquid  Administration via: Cup;Straw Medication Administration: Whole meds with puree Supervision: Staff to assist with self-feeding;Full supervision/cueing for swallowing strategies Swallowing strategies  : Minimize environmental distractions;Slow rate;Small bites/sips;Clear throat intermittently Postural changes: Position pt fully upright for meals;Stay upright 30-60 min after meals Oral care recommendations: Oral care BID (2x/day)    Amil Kale, M.A., CCC-SLP Speech Language Pathology, Acute Rehabilitation Services  Secure Chat preferred [phone]  12/15/2023,2:39 PM

## 2023-12-15 NOTE — Progress Notes (Signed)
 Echocardiogram 2D Echocardiogram has been performed.  Gilbert Morse 12/15/2023, 5:37 PM

## 2023-12-15 NOTE — Progress Notes (Signed)
 PT Cancellation Note  Patient Details Name: Gilbert Morse MRN: [id number] DOB: 1931-02-06   Cancelled Treatment:    Reason Eval/Treat Not Completed: Patient not medically ready (pt with elevated ddimer, await LB doppler to rule out DVT)   [name] B Evelina Lore 12/15/2023, 8:58 AM Annis Baseman, PT Acute Rehabilitation Services Office: [phone]

## 2023-12-15 NOTE — Evaluation (Addendum)
 Clinical/Bedside Swallow Evaluation Patient Details  Name: Gilbert Morse MRN: [id number] Date of Birth: 1931-05-09  Today's Date: 12/15/2023 Time: SLP Start Time (ACUTE ONLY): 1115 SLP Stop Time (ACUTE ONLY): 1135 SLP Time Calculation (min) (ACUTE ONLY): 20 min  Past Medical History:  Past Medical History:  Diagnosis Date   Arthritis    Enlarged prostate    Falls 06/27/2019   last fall was 2 weeks ago    Heart murmur    History of kidney stones    only one as a young man , passed independently    HTN (hypertension)    Past Surgical History:  Past Surgical History:  Procedure Laterality Date   BACK SURGERY     total 6 surgeries  . inluding discectomy, fusion with harvest from right hip", rods in place    TOTAL KNEE ARTHROPLASTY Left 07/02/2019   Procedure: Left Knee Arthroplasty;  Surgeon: Gilbert Hamburger, MD;  Location: [facility];  Service: Orthopedics;  Laterality: Left;   HPI:  Gilbert Morse is a 89 y.o. male with past medical history  of hearing loss, vitreous hemorrhage,Hypertension, hypothyroidism, BPH, coming from home due to a fall.  Patient brought by EMS.  Patient uses a walker and hurt his left shoulder no reports of loss of consciousness.  And did not hit his head per report.  Patient is a limited historian.  Patient is part of the palliative care program with other healthcare and has home health services and physical therapy and home health aide. CT chest: Bibasilar consolidations with multiple calcifications, left greater than right, most likely due to inspissated calcified small airway disease such as due to chronic aspiration.    Assessment / Plan / Recommendation  Clinical Impression  Swallow evaluation complete at bedside and overall, patient is without overt indication of aspiration. Some very subtle wet vocal quality noted towards end of session however otherwise patient with appropriate oral manipulation and control of bolus and seemingly age appropriate swallow  initiation response. Daughter present and reports no concerns in relation to swallowing. CT results with Bibasilar consolidations with multiple calcifications, left greater than right, most likely due to inspissated calcified small airway disease such as due to chronic aspiration. Sent to secure chat to MD. If concerns are present for chronic aspiration, this would likely be silent in nature warranting instrumental testing. If not, then continuing on a regular diet without SLP f/u is appropriate. Will plan to f/u next date for MD feedback and need for additional testing.  Following eval received a message from Dr. Nichole Morse who wishes for MBS to evaluate swallowing function given CT chest results. Will plan for MBS next date.   SLP Visit Diagnosis: Dysphagia, unspecified (R13.10)       Diet Recommendation Regular;Thin liquid    Liquid Administration via: Cup;Straw Medication Administration: Whole meds with liquid Supervision: Patient able to self feed Compensations: Slow rate;Small sips/bites Postural Changes: Seated upright at 90 degrees    Other  Recommendations Oral Care Recommendations: Oral care BID    Recommendations for follow up therapy are one component of a multi-disciplinary discharge planning process, led by the attending physician.  Recommendations may be updated based on patient status, additional functional criteria and insurance authorization.    Swallow Study   General HPI: Gilbert Morse is a 89 y.o. male with past medical history  of hearing loss, vitreous hemorrhage,Hypertension, hypothyroidism, BPH, coming from home due to a fall.  Patient brought by EMS.  Patient uses a walker and  hurt his left shoulder no reports of loss of consciousness.  And did not hit his head per report.  Patient is a limited historian.  Patient is part of the palliative care program with other healthcare and has home health services and physical therapy and home health aide. CT chest: Bibasilar  consolidations with multiple calcifications, left greater than right, most likely due to inspissated calcified small airway disease such as due to chronic aspiration. Type of Study: Bedside Swallow Evaluation Previous Swallow Assessment: none Diet Prior to this Study: Dysphagia 1 (pureed);Thin liquids (Level 0) Temperature Spikes Noted: No Respiratory Status: Room air History of Recent Intubation: No Behavior/Cognition: Alert;Cooperative;Pleasant mood;Confused Oral Cavity Assessment: Dry Oral Care Completed by SLP: Recent completion by staff Oral Cavity - Dentition: Adequate natural dentition Vision: Functional for self-feeding (but needs assist for set up per daughter and does better with finger foods) Self-Feeding Abilities: Able to feed self;Needs set up Patient Positioning: Upright in bed Baseline Vocal Quality: Normal Volitional Cough: Strong Volitional Swallow: Able to elicit    Oral/Motor/Sensory Function Overall Oral Motor/Sensory Function: Within functional limits   Ice Chips Ice chips: Not tested   Thin Liquid Thin Liquid: Within functional limits Presentation: Cup;Self Fed;Straw    Nectar Thick Nectar Thick Liquid: Not tested   Honey Thick Honey Thick Liquid: Not tested   Puree Puree: Within functional limits Presentation: Spoon   Solid     Solid: Within functional limits Presentation: Self Fed     [employer] MA, CCC-SLP  Gilbert Morse 12/15/2023,11:44 AM

## 2023-12-15 NOTE — Plan of Care (Signed)

## 2023-12-15 NOTE — NC FL2 (Signed)
 [place]  MEDICAID FL2 LEVEL OF CARE FORM     IDENTIFICATION  Patient Name: Gilbert Morse Birthdate: 11/12/1930 Sex: male Admission Date (Current Location): 12/14/2023  [facility] and [place] Number:  Producer, television/film/video and Address:  [facility], [address], [place], [place] [address]      Provider Number: [id number]  Attending Physician Name and Address:  [name], MD  Relative Name and Phone Number:  Tor Freed (Daughter)  [phone]    Current Level of Care: Hospital Recommended Level of Care: Skilled Nursing Facility Prior Approval Number:    Date Approved/Denied:   PASRR Number: [id number]  Discharge Plan: SNF    Current Diagnoses: Patient Active Problem List   Diagnosis Date Noted   Ambulatory dysfunction 12/14/2023   Pressure injury of skin 07/08/2019   Status post total left knee replacement 07/04/2019   S/P total knee arthroplasty, left 07/02/2019   Degenerative arthritis of left knee 06/28/2019   Bilateral impacted cerumen 03/19/2016   Presbycusis of both ears 03/19/2016    Orientation RESPIRATION BLADDER Height & Weight     Self  Normal Incontinent Weight: 181 lb 10.5 oz (82.4 kg) Height:     BEHAVIORAL SYMPTOMS/MOOD NEUROLOGICAL BOWEL NUTRITION STATUS      Continent Diet (see DC summary)  AMBULATORY STATUS COMMUNICATION OF NEEDS Skin   Extensive Assist Verbally Other (Comment), PU Stage and Appropriate Care (Non-pressure wound Shoulder Posterior;Right Skin was stuck to mat that patient came up on. When removing mat, small bloody area was noted. Cleansed and gauze placed. Buttocks Right Pressure Injury Stage II)   PU Stage 2 Dressing: Daily                   Personal Care Assistance Level of Assistance  Bathing, Feeding, Dressing Bathing Assistance: Maximum assistance Feeding assistance: Limited assistance Dressing Assistance: Maximum assistance     Functional Limitations Info  Sight, Hearing,  Speech Sight Info: Impaired Hearing Info: Impaired Speech Info: Adequate    SPECIAL CARE FACTORS FREQUENCY  PT (By licensed PT), OT (By licensed OT)     PT Frequency: 5x/week OT Frequency: 5x/week            Contractures Contractures Info: Not present    Additional Factors Info  Code Status, Allergies Code Status Info: FULL Allergies Info: NKA           Current Medications (12/15/2023):  This is the current hospital active medication list Current Facility-Administered Medications  Medication Dose Route Frequency Provider Last Rate Last Admin   acetaminophen  (TYLENOL ) tablet 650 mg  650 mg Oral Q6H PRN Patel, Ekta V, MD       Or   acetaminophen  (TYLENOL ) suppository 650 mg  650 mg Rectal Q6H PRN Patel, Ekta V, MD       finasteride  (PROSCAR ) tablet 5 mg  5 mg Oral QHS Patel, Ekta V, MD   5 mg at 12/14/23 2156   heparin  injection 5,000 Units  5,000 Units Subcutaneous Q12H Patel, Ekta V, MD   5,000 Units at 12/15/23 0957   HYDROcodone -acetaminophen  (NORCO/VICODIN) 5-325 MG per tablet 1 tablet  1 tablet Oral Q4H PRN Patel, Ekta V, MD   1 tablet at 12/14/23 2156   levothyroxine  (SYNTHROID ) tablet 25 mcg  25 mcg Oral Q0600 Lavanda Porter, MD   25 mcg at 12/15/23 4132   morphine  (PF) 2 MG/ML injection 2 mg  2 mg Intravenous Q4H PRN Lavanda Porter, MD  sodium chloride  flush (NS) 0.9 % injection 3 mL  3 mL Intravenous Q12H Brunilda Capra V, MD   3 mL at 12/15/23 1122   tamsulosin  (FLOMAX ) capsule 0.4 mg  0.4 mg Oral QHS Patel, Ekta V, MD   0.4 mg at 12/14/23 2156     Discharge Medications: Please see discharge summary for a list of discharge medications.  Relevant Imaging Results:  Relevant Lab Results:   Additional Information LKG:[id number]  Chevi Lim A [place], LCSW

## 2023-12-15 NOTE — Progress Notes (Signed)
 Patient keeps ripping tele and gown off.

## 2023-12-15 NOTE — Care Management Obs Status (Cosign Needed)
 MEDICARE OBSERVATION STATUS NOTIFICATION   Patient Details  Name: Gilbert Morse MRN: [id number] Date of Birth: 12-13-1930   Medicare Observation Status Notification Given:  Yes    Dane Dung, RN 12/15/2023, 4:31 PM

## 2023-12-16 DIAGNOSIS — W19XXXD Unspecified fall, subsequent encounter: Secondary | ICD-10-CM | POA: Diagnosis not present

## 2023-12-16 DIAGNOSIS — S3210XA Unspecified fracture of sacrum, initial encounter for closed fracture: Secondary | ICD-10-CM

## 2023-12-16 DIAGNOSIS — M25511 Pain in right shoulder: Secondary | ICD-10-CM | POA: Diagnosis not present

## 2023-12-16 DIAGNOSIS — E43 Unspecified severe protein-calorie malnutrition: Secondary | ICD-10-CM | POA: Diagnosis not present

## 2023-12-16 DIAGNOSIS — I10 Essential (primary) hypertension: Secondary | ICD-10-CM | POA: Diagnosis not present

## 2023-12-16 DIAGNOSIS — R262 Difficulty in walking, not elsewhere classified: Secondary | ICD-10-CM | POA: Diagnosis not present

## 2023-12-16 DIAGNOSIS — Z7189 Other specified counseling: Secondary | ICD-10-CM | POA: Diagnosis not present

## 2023-12-16 DIAGNOSIS — Z515 Encounter for palliative care: Secondary | ICD-10-CM | POA: Diagnosis not present

## 2023-12-16 DIAGNOSIS — F0390 Unspecified dementia without behavioral disturbance: Secondary | ICD-10-CM

## 2023-12-16 MED ORDER — ENSURE ENLIVE PO LIQD
237.0000 mL | Freq: Two times a day (BID) | ORAL | Status: DC
  Administered 2023-12-16 – 2023-12-23 (×13): 237 mL via ORAL

## 2023-12-16 MED ORDER — JUVEN PO PACK
1.0000 | PACK | Freq: Two times a day (BID) | ORAL | Status: DC
Start: 2023-12-16 — End: 2023-12-23
  Administered 2023-12-16 – 2023-12-23 (×13): 1 via ORAL
  Filled 2023-12-16 (×12): qty 1

## 2023-12-16 MED ORDER — THIAMINE MONONITRATE 100 MG PO TABS
100.0000 mg | ORAL_TABLET | Freq: Every day | ORAL | Status: AC
  Administered 2023-12-16 – 2023-12-19 (×4): 100 mg via ORAL
  Filled 2023-12-16 (×5): qty 1

## 2023-12-16 MED ORDER — LIDOCAINE 5 % EX PTCH
1.0000 | MEDICATED_PATCH | CUTANEOUS | Status: DC
  Administered 2023-12-16 – 2023-12-22 (×7): 1 via TRANSDERMAL
  Filled 2023-12-16 (×7): qty 1

## 2023-12-16 MED ORDER — ADULT MULTIVITAMIN W/MINERALS CH
1.0000 | ORAL_TABLET | Freq: Every day | ORAL | Status: DC
  Administered 2023-12-16 – 2023-12-23 (×8): 1 via ORAL
  Filled 2023-12-16 (×8): qty 1

## 2023-12-16 NOTE — Progress Notes (Signed)
 Attempted to see patient with am meal, patient sleeping soundly but woken easily however when attempting to reposition patient upset, stating that he did not want to sit up at this time. Based on plan, primary rationale for treatment today is education and patient will not be able to carryover compensatory strategies independently anyway given cognitive deficits. Allowed patient to rest and contacted patient's sister via phone. Reviewed MBS results and recommendations. She verbalized understanding. No further SLP needs indicated on the acute care level as swallowing function largely WFL/age appropriate.   Gilbert Krabill MA, CCC-SLP

## 2023-12-16 NOTE — Progress Notes (Signed)
 [name]                                                                               Gilbert Morse, is a 89 y.o. male, DOB - Jul 20, 1931, GMW:[id number] Admit date - 12/14/2023    Outpatient Primary MD for the patient is Gilbert Morse, Gilbert [place], MD  LOS - 0  days    Brief summary   Gilbert Morse is a 89 y.o. male with past medical history  of hearing loss, vitreous hemorrhage,Hypertension, hypothyroidism, BPH, coming from home due to a fall.  Patient brought by EMS.  Patient uses a walker and hurt his left shoulder no reports of loss of consciousness.     Assessment & Plan    Assessment and Plan:  Multiple falls at home as per his daughter.  CT lumbar spine shows Irregularity of the anterior cortex of S1 which may reflect mildly displaced fracture. No significant retropulsion. Neurosurgery  consulted by EDP, recommended WBAT.  Therapy evaluations ordered.  Family is requesting short term rehab. Will wait for therapy evaluations.      Elevated d dimer Venous duplex of lower extremities IS negative for DVT.  Ct angio of the chest is negative for segmental PE.    Hypertension Optimal, continue to hold BP meds.  Check orthostatic vital signs.     Hypothyroidism:  Resume synthroid .    BPH Continue with flomax .    Syncope CT angio ruled out PE.  Echo is unremarkable.  Telemetry unremarkable.    Leukocytosis:  Improving. No pneumonia.  UA ordered and pending.    Estimated body mass index is 23.49 kg/m as calculated from the following:   Height as of 06/02/23: 6\' 2"  (1.88 m).   Weight as of this encounter: 83 kg.  Code Status: full code.  DVT Prophylaxis:  heparin  injection 5,000 Units Start: 12/14/23 2200   Level of Care: Level of care: Telemetry Medical Family Communication: Updated patient's daughter over the phone.   Disposition Plan:     Remains inpatient appropriate:  pending therapy evaluation.  Procedures:  None.   Consultants:    Palliative care   Antimicrobials:   Anti-infectives (From admission, onward)    None        Medications  Scheduled Meds:  feeding supplement  237 mL Oral BID BM   finasteride   5 mg Oral QHS   heparin   5,000 Units Subcutaneous Q12H   levothyroxine   25 mcg Oral Q0600   multivitamin with minerals  1 tablet Oral Daily   nutrition supplement (JUVEN)  1 packet Oral BID BM   sodium chloride  flush  3 mL Intravenous Q12H   tamsulosin   0.4 mg Oral QHS   thiamine   100 mg Oral Daily   Continuous Infusions: PRN Meds:.acetaminophen  **OR** acetaminophen , HYDROcodone -acetaminophen , morphine  injection    Subjective:   [name] was seen and examined today. Remains confused.   Objective:   Vitals:   12/16/23 0435 12/16/23 0500 12/16/23 0813 12/16/23 1155  BP:  125/60 (!) 119/56 (!) 150/59  Pulse:  97 71 77  Resp:  18 17 17   Temp:  98 F (36.7 C) 99.6 F (37.6 C) 98 F (  36.7 C)  TempSrc:  Oral Axillary Oral  SpO2:  96% 96% 96%  Weight: 83 kg       Intake/Output Summary (Last 24 hours) at 12/16/2023 1337 Last data filed at 12/16/2023 1215 Gross per 24 hour  Intake 118 ml  Output 200 ml  Net -82 ml   Filed Weights   12/15/23 0500 12/16/23 0435  Weight: 82.4 kg 83 kg     Exam General exam: Appears calm and comfortable  Respiratory system: Clear to auscultation. Respiratory effort normal. Cardiovascular system: S1 & S2 heard, RRR. No JVD,  Gastrointestinal system: Abdomen is nondistended, soft and nontender.  Central nervous system: Alert and oriented to person.  Extremities: no pedal edema.  Skin: No rashes,  Psychiatry: mood is appropriate.      Data Reviewed:  I have personally reviewed following labs and imaging studies   CBC Lab Results  Component Value Date   WBC 13.7 (H) 12/15/2023   RBC 4.22 12/15/2023   HGB 11.5 (L) 12/15/2023   HCT 35.6 (L) 12/15/2023   MCV 84.4 12/15/2023   MCH 27.3 12/15/2023   PLT 205 12/15/2023   MCHC 32.3 12/15/2023    RDW 16.8 (H) 12/15/2023   LYMPHSABS 0.4 (L) 12/14/2023   MONOABS 1.3 (H) 12/14/2023   EOSABS 0.0 12/14/2023   BASOSABS 0.1 12/14/2023     Last metabolic panel Lab Results  Component Value Date   NA 138 12/15/2023   K 3.7 12/15/2023   CL 102 12/15/2023   CO2 24 12/15/2023   BUN 34 (H) 12/15/2023   CREATININE 1.15 12/15/2023   GLUCOSE 121 (H) 12/15/2023   GFRNONAA >60 12/15/2023   GFRAA >60 07/03/2019   CALCIUM 8.4 (L) 12/15/2023   PROT 6.0 (L) 12/15/2023   ALBUMIN 2.7 (L) 12/15/2023   BILITOT 1.1 12/15/2023   ALKPHOS 74 12/15/2023   AST 45 (H) 12/15/2023   ALT 19 12/15/2023   ANIONGAP 12 12/15/2023    CBG (last 3)  No results for input(s): "GLUCAP" in the last 72 hours.    Coagulation Profile: No results for input(s): "INR", "PROTIME" in the last 168 hours.   Radiology Studies: VAS US  LOWER EXTREMITY VENOUS (DVT) Result Date: 12/15/2023  Lower Venous DVT Study Patient Name:  Gilbert Morse  Date of Exam:   12/15/2023 Medical Rec #: [id number]         Accession #:    [id number] Date of Birth: 1931-04-03          Patient Gender: M Patient Age:   48 years Exam Location:  [facility] Procedure:      VAS US  LOWER EXTREMITY VENOUS (DVT) Referring Phys: [name] --------------------------------------------------------------------------------  Indications: Swelling, Pain, Edema, and Patient had a recent fall.  Comparison Study: 05-17-19 - Negative Performing Technologist: Franky Ivanoff Sturdivant-Jones RDMS, RVT  Examination Guidelines: A complete evaluation includes B-mode imaging, spectral Doppler, color Doppler, and power Doppler as needed of all accessible portions of each vessel. Bilateral testing is considered an integral part of a complete examination. Limited examinations for reoccurring indications may be performed as noted. The reflux portion of the exam is performed with the patient in reverse Trendelenburg.   +---------+---------------+---------+-----------+----------+--------------+ RIGHT    CompressibilityPhasicitySpontaneityPropertiesThrombus Aging +---------+---------------+---------+-----------+----------+--------------+ CFV      Full           Yes      Yes                                 +---------+---------------+---------+-----------+----------+--------------+  SFJ      Full                                                        +---------+---------------+---------+-----------+----------+--------------+ FV Prox  Full                                                        +---------+---------------+---------+-----------+----------+--------------+ FV Mid   Full                                                        +---------+---------------+---------+-----------+----------+--------------+ FV DistalFull                                                        +---------+---------------+---------+-----------+----------+--------------+ PFV      Full                                                        +---------+---------------+---------+-----------+----------+--------------+ POP      Full           Yes      Yes                                 +---------+---------------+---------+-----------+----------+--------------+ PTV      Full                                                        +---------+---------------+---------+-----------+----------+--------------+ PERO     Full                                                        +---------+---------------+---------+-----------+----------+--------------+   +---------+---------------+---------+-----------+----------+--------------+ LEFT     CompressibilityPhasicitySpontaneityPropertiesThrombus Aging +---------+---------------+---------+-----------+----------+--------------+ CFV      Full           Yes      Yes                                  +---------+---------------+---------+-----------+----------+--------------+ SFJ      Full                                                        +---------+---------------+---------+-----------+----------+--------------+  FV Prox  Full                                                        +---------+---------------+---------+-----------+----------+--------------+ FV Mid   Full                                                        +---------+---------------+---------+-----------+----------+--------------+ FV DistalFull                                                        +---------+---------------+---------+-----------+----------+--------------+ PFV      Full                                                        +---------+---------------+---------+-----------+----------+--------------+ POP      Full           Yes                                          +---------+---------------+---------+-----------+----------+--------------+ PTV      Full                                                        +---------+---------------+---------+-----------+----------+--------------+ PERO     Full                                                        +---------+---------------+---------+-----------+----------+--------------+     Summary: BILATERAL: - No evidence of deep vein thrombosis seen in the lower extremities, bilaterally. -No evidence of popliteal cyst, bilaterally.   *See table(s) above for measurements and observations. Electronically signed by Runell Countryman on 12/15/2023 at 5:50:12 PM.    Final    ECHOCARDIOGRAM COMPLETE Result Date: 12/15/2023    ECHOCARDIOGRAM REPORT   Patient Name:   NICOS NICLEY Date of Exam: 12/15/2023 Medical Rec #:  [id number]        Height:       74.0 in Accession #:    [id number]       Weight:       181.7 lb Date of Birth:  Apr 12, 1931         BSA:          2.086 m Patient Age:    89 years         BP:           128/60 mmHg Patient  Gender: M  HR:           93 bpm. Exam Location:  Inpatient Procedure: 2D Echo, Cardiac Doppler and Color Doppler (Both Spectral and Color            Flow Doppler were utilized during procedure). Indications:    Syncope R55  History:        Patient has no prior history of Echocardiogram examinations.  Sonographer:    Terrilee Few RCS Referring Phys: Myron Asters  Sonographer Comments: Image acquisition challenging due to patient behavioral factors. and Image acquisition challenging due to uncooperative patient. IMPRESSIONS  1. There is significant septal dyssynchony related to bundle branch block. Left ventricular ejection fraction, by estimation, is 60 to 65%. The left ventricle has normal function. The left ventricle has no regional wall motion abnormalities. There is mild concentric left ventricular hypertrophy. Left ventricular diastolic parameters are indeterminate.  2. Right ventricular systolic function is normal. The right ventricular size is normal.  3. The mitral valve is normal in structure. Trivial mitral valve regurgitation. No evidence of mitral stenosis.  4. The aortic valve is normal in structure. There is mild calcification of the aortic valve. Aortic valve regurgitation is not visualized. No aortic stenosis is present.  5. Aortic dilatation noted. There is borderline dilatation of the aortic root, measuring 38 mm.  6. The inferior vena cava is normal in size with greater than 50% respiratory variability, suggesting right atrial pressure of 3 mmHg. FINDINGS  Left Ventricle: There is significant septal dyssynchony related to bundle branch block. Left ventricular ejection fraction, by estimation, is 60 to 65%. The left ventricle has normal function. The left ventricle has no regional wall motion abnormalities. The left ventricular internal cavity size was normal in size. There is mild concentric left ventricular hypertrophy. Abnormal (paradoxical) septal motion, consistent with  left bundle branch block. Left ventricular diastolic parameters are indeterminate. Right Ventricle: The right ventricular size is normal. No increase in right ventricular wall thickness. Right ventricular systolic function is normal. Left Atrium: Left atrial size was normal in size. Right Atrium: Right atrial size was normal in size. Pericardium: Trivial pericardial effusion is present. Mitral Valve: The mitral valve is normal in structure. Trivial mitral valve regurgitation. No evidence of mitral valve stenosis. Tricuspid Valve: The tricuspid valve is normal in structure. Tricuspid valve regurgitation is trivial. No evidence of tricuspid stenosis. Aortic Valve: The aortic valve is normal in structure. There is mild calcification of the aortic valve. Aortic valve regurgitation is not visualized. No aortic stenosis is present. Aortic valve peak gradient measures 5.8 mmHg. Pulmonic Valve: The pulmonic valve was not well visualized. Pulmonic valve regurgitation is not visualized. No evidence of pulmonic stenosis. Aorta: Aortic dilatation noted. There is borderline dilatation of the aortic root, measuring 38 mm. Venous: The inferior vena cava is normal in size with greater than 50% respiratory variability, suggesting right atrial pressure of 3 mmHg. IAS/Shunts: No atrial level shunt detected by color flow Doppler.  LEFT VENTRICLE PLAX 2D LVIDd:         4.90 cm LVIDs:         3.40 cm LV PW:         1.00 cm LV IVS:        1.10 cm LVOT diam:     2.30 cm LV SV:         85 LV SV Index:   41 LVOT Area:     4.15 cm  IVC IVC diam: 1.30 cm LEFT ATRIUM  Index LA diam:    3.10 cm 1.49 cm/m  AORTIC VALVE AV Area (Vmax): 4.05 cm AV Vmax:        120.00 cm/s AV Peak Grad:   5.8 mmHg LVOT Vmax:      117.00 cm/s LVOT Vmean:     77.900 cm/s LVOT VTI:       0.205 m  AORTA Ao Root diam: 3.80 cm Ao Asc diam:  3.40 cm TRICUSPID VALVE TR Peak grad:   33.4 mmHg TR Vmax:        289.00 cm/s  SHUNTS Systemic VTI:  0.20 m Systemic Diam:  2.30 cm Jules Oar MD Electronically signed by Jules Oar MD Signature Date/Time: 12/15/2023/5:46:08 PM    Final    DG Swallowing Func-Speech Pathology Result Date: 12/15/2023 Table formatting from the original result was not included. Modified Barium Swallow Study Patient Details Name: Gilbert Morse MRN: [id number] Date of Birth: Jan 03, 1931 Today's Date: 12/15/2023 HPI/PMH: HPI: SHAMMAH BORNT is a 89 y.o. male with past medical history  of hearing loss, vitreous hemorrhage,Hypertension, hypothyroidism, BPH, coming from home due to a fall.  Patient brought by EMS.  Patient uses a walker and hurt his left shoulder no reports of loss of consciousness.  And did not hit his head per report.  Patient is a limited historian.  Patient is part of the palliative care program with other healthcare and has home health services and physical therapy and home health aide. CT chest: Bibasilar consolidations with multiple calcifications, left greater than right, most likely due to inspissated calcified small airway disease such as due to chronic aspiration. Clinical Impression: Clinical Impression: Pt presents with mild oropharyngeal dysphagia characterized by slightly impaired timing and strength. Anterior hyoid movement and epiglottic inversion are complete but due to mistiming, laryngeal closure is incomplete. This allows quick moving thin liquids to enter the laryngeal vestibule and progress to the level of the vocal folds in trace quantities (PAS 4). Pt senses frank penetration and initiates throat clearance, which is effective at clearing the majority of already trace penetrates. The amount that is left is expelled by subsequent swallows. Recommend he continue his current diet. SLP will f/u at least briefly to provide education and reinforce use of compensatory strategies. Factors that may increase risk of adverse event in presence of aspiration Roderick Civatte & Jessy Morocco 2021): Factors that may increase risk of adverse  event in presence of aspiration Roderick Civatte & Jessy Morocco 2021): Limited mobility; Frail or deconditioned Recommendations/Plan: Swallowing Evaluation Recommendations Swallowing Evaluation Recommendations Recommendations: PO diet PO Diet Recommendation: Regular; Thin liquids (Level 0) Liquid Administration via: Cup; Straw Medication Administration: Whole meds with puree Supervision: Staff to assist with self-feeding; Full supervision/cueing for swallowing strategies Swallowing strategies  : Minimize environmental distractions; Slow rate; Small bites/sips; Clear throat intermittently Postural changes: Position pt fully upright for meals; Stay upright 30-60 min after meals Oral care recommendations: Oral care BID (2x/day) Treatment Plan Treatment Plan Treatment recommendations: Therapy as outlined in treatment plan below Follow-up recommendations: Skilled nursing-short term rehab (<3 hours/day) Functional status assessment: Patient has had a recent decline in their functional status and demonstrates the ability to make significant improvements in function in a reasonable and predictable amount of time. Treatment frequency: Min 2x/week Treatment duration: 1 week Interventions: Oropharyngeal exercises; Compensatory techniques; Patient/family education; Trials of upgraded texture/liquids; Diet toleration management by SLP Recommendations Recommendations for follow up therapy are one component of a multi-disciplinary discharge planning process, led by the attending physician.  Recommendations may be updated  based on patient status, additional functional criteria and insurance authorization. Assessment: Orofacial Exam: Orofacial Exam Oral Cavity: Oral Hygiene: WFL Oral Cavity - Dentition: Adequate natural dentition Orofacial Anatomy: WFL Oral Motor/Sensory Function: WFL Anatomy: Anatomy: Suspected cervical osteophytes Boluses Administered: Boluses Administered Boluses Administered: Thin liquids (Level 0); Mildly thick liquids  (Level 2, nectar thick); Moderately thick liquids (Level 3, honey thick); Puree; Solid  Oral Impairment Domain: Oral Impairment Domain Lip Closure: No labial escape Tongue control during bolus hold: Cohesive bolus between tongue to palatal seal Bolus preparation/mastication: Timely and efficient chewing and mashing Bolus transport/lingual motion: Brisk tongue motion Oral residue: Trace residue lining oral structures Location of oral residue : Tongue Initiation of pharyngeal swallow : Posterior angle of the ramus  Pharyngeal Impairment Domain: Pharyngeal Impairment Domain Soft palate elevation: No bolus between soft palate (SP)/pharyngeal wall (PW) Laryngeal elevation: Complete superior movement of thyroid cartilage with complete approximation of arytenoids to epiglottic petiole Anterior hyoid excursion: Complete anterior movement Epiglottic movement: Complete inversion Laryngeal vestibule closure: Complete, no air/contrast in laryngeal vestibule Pharyngeal stripping wave : Present - complete Pharyngeal contraction (A/P view only): N/A Pharyngoesophageal segment opening: Complete distension and complete duration, no obstruction of flow Tongue base retraction: Trace column of contrast or air between tongue base and PPW Pharyngeal residue: Trace residue within or on pharyngeal structures Location of pharyngeal residue: Tongue base; Valleculae  Esophageal Impairment Domain: No data recorded Pill: No data recorded Penetration/Aspiration Scale Score: Penetration/Aspiration Scale Score 1.  Material does not enter airway: Mildly thick liquids (Level 2, nectar thick); Moderately thick liquids (Level 3, honey thick); Puree; Solid 5.  Material enters airway, CONTACTS cords and not ejected out: Thin liquids (Level 0) Compensatory Strategies: Compensatory Strategies Compensatory strategies: Yes Straw: Effective; Ineffective Effective Straw: Mildly thick liquid (Level 2, nectar thick) Ineffective Straw: Thin liquid (Level 0) Chin  tuck: Effective Effective Chin Tuck: Thin liquid (Level 0)   General Information: Caregiver present: No  Diet Prior to this Study: Regular; Thin liquids (Level 0)   Temperature : Normal   Respiratory Status: WFL   Supplemental O2: None (Room air)   History of Recent Intubation: No  Behavior/Cognition: Alert; Cooperative; Pleasant mood; Confused Self-Feeding Abilities: Able to self-feed Baseline vocal quality/speech: Normal Volitional Cough: Able to elicit Volitional Swallow: Able to elicit Exam Limitations: No limitations Goal Planning: Prognosis for improved oropharyngeal function: Good Barriers to Reach Goals: Cognitive deficits No data recorded Patient/Family Stated Goal: none stated Consulted and agree with results and recommendations: Patient Pain: Pain Assessment Pain Assessment: Faces Faces Pain Scale: 8 Breathing: 0 Negative Vocalization: 1 Facial Expression: 1 Body Language: 1 Consolability: 0 PAINAD Score: 3 Pain Location: back when transferring Pain Descriptors / Indicators: Aching; Discomfort; Grimacing; Guarding; Moaning Pain Intervention(s): Monitored during session; Repositioned End of Session: Start Time:SLP Start Time (ACUTE ONLY): 1400 Stop Time: SLP Stop Time (ACUTE ONLY): 1412 Time Calculation:SLP Time Calculation (min) (ACUTE ONLY): 12 min Charges: SLP Evaluations $ SLP Speech Visit: 1 Visit SLP Evaluations $BSS Swallow: 1 Procedure $MBS Swallow: 1 Procedure SLP visit diagnosis: SLP Visit Diagnosis: Dysphagia, oropharyngeal phase (R13.12) Past Medical History: Past Medical History: Diagnosis Date  Arthritis   Enlarged prostate   Falls 06/27/2019  last fall was 2 weeks ago   Heart murmur   History of kidney stones   only one as a young man , passed independently   HTN (hypertension)  Past Surgical History: Past Surgical History: Procedure Laterality Date  BACK SURGERY    total 6 surgeries  .  inluding discectomy, fusion with harvest from right hip", rods in place   TOTAL KNEE ARTHROPLASTY Left  07/02/2019  Procedure: Left Knee Arthroplasty;  Surgeon: Wendolyn Hamburger, MD;  Location: [facility];  Service: Orthopedics;  Laterality: Left; Amil Kale, M.A., CCC-SLP Speech Language Pathology, Acute Rehabilitation Services Secure Chat preferred [phone] 12/15/2023, 2:45 PM  CT Angio Chest Pulmonary Embolism (PE) W or WO Contrast Result Date: 12/14/2023 CLINICAL DATA:  Fall injury earlier today, now with suspected pulmonary embolism, high probability. There is ribcage pain, hypertension, and abnormal D-dimer. EXAM: CT ANGIOGRAPHY CHEST WITH CONTRAST TECHNIQUE: Multidetector CT imaging of the chest was performed using the standard protocol during bolus administration of intravenous contrast. Multiplanar CT image reconstructions and MIPs were obtained to evaluate the vascular anatomy. RADIATION DOSE REDUCTION: This exam was performed according to the departmental dose-optimization program which includes automated exposure control, adjustment of the mA and/or kV according to patient size and/or use of iterative reconstruction technique. CONTRAST:  65mL OMNIPAQUE  IOHEXOL  350 MG/ML SOLN COMPARISON:  Last chest x-ray was PA and lateral 06/27/2019. No prior cross-sectional imaging for comparison. He had a lumbar spine CT earlier today but this did not include the chest. FINDINGS: Cardiovascular: There is mild panchamber cardiomegaly. No evidence of right heart strain. There are left main and patchy three-vessel coronary artery calcifications and a small pericardial effusion anteriorly. Pulmonary arteries are adequately opacified and free of thrombus at least to the segmental level. The subsegmental arteries are largely obscured by breathing motion and not well seen. Subsegmental emboli could be missed. The the aorta is tortuous with moderate patchy calcific plaques, scattered calcification in the great vessels. There is no aneurysm, stenosis or dissection.  No venous dilatation. Mediastinum/Nodes: There is a mildly  patulous esophagus with normal wall thickness. Query chronic dysmotility. The thoracic trachea and both main bronchi are patent. No thyroid or axillary mass is seen. There is no intrathoracic adenopathy. Lungs/Pleura: There are bilateral layering trace pleural effusions. No pneumothorax or hemothorax. There is a small posterior basal consolidation in the left lower lobe with multiple punctate and curvilinear calcifications within the consolidation, extending medially adjacent the left hemidiaphragm. This is most likely chronic consolidation due to inspissated calcified small airway disease such as due to chronic aspiration. superimposed active infection is not strictly excluded. This is further supported by the observation of volume loss in the left lower lobe base. There is similar atelectasis with calcifications although over a smaller area, in the right lower lobe base. No focal consolidations are noted elsewhere. There is a 5 mm nodule anteriorly in the base of the right upper lobe on 7:60. There are no other appreciable nodules. Upper Abdomen: Moderate hepatic steatosis. Scattered punctate nonobstructive caliceal stones upper pole left kidney. No acute upper abdominal findings. Abdominal aortic atherosclerosis Musculoskeletal: There are bridging syndesmophytes and bridging enthesopathy throughout the thoracic spine, osteopenia, multilevel collapsed and ankylosed disc spaces. No acute compression fracture is seen. The ribcage is grossly intact. There is moderate bilateral gynecomastia with no other focal chest wall abnormality. There is moderately advanced bilateral shoulder arthrosis, and on the left there is acromiohumeral abutment consistent with chronic rotator cuff arthropathy with a likely degenerative tear. Review of the MIP images confirms the above findings. IMPRESSION: 1. No evidence of arterial thrombosis at least to the segmental level. The subsegmental arteries are largely obscured by breathing  motion and not well seen. Subsegmental emboli could be missed. 2. Cardiomegaly with small pericardial effusion. 3. Aortic and coronary artery atherosclerosis. 4. Trace  bilateral pleural effusions. 5. Bibasilar consolidations with multiple calcifications, left greater than right, most likely due to inspissated calcified small airway disease such as due to chronic aspiration. Superimposed active infection is not strictly excluded. No consolidation is seen elsewhere. 6. 5 mm nodule anteriorly in the base of the right upper lobe. Per Fleischner Society Guidelines, if patient is low risk for malignancy, no routine follow-up imaging is recommended. If patient is high risk for malignancy, a non-contrast Chest CT at 12 months is optional. If performed and the nodule is stable at 12 months, no further follow-up is recommended. These guidelines do not apply to immunocompromised patients and patients with cancer. Follow up in patients with significant comorbidities as clinically warranted. For lung cancer screening, adhere to Lung-RADS guidelines. Reference: Radiology. 2017; 284(1):228-43. 7. Hepatic steatosis. 8. Nonobstructive left micronephrolithiasis. 9. Osteopenia and degenerative change. No acute compression fracture is seen. 10. Gynecomastia. Aortic Atherosclerosis (ICD10-I70.0). Electronically Signed   By: Denman Fischer M.D.   On: 12/14/2023 21:42       [name] M.D. [name] 12/16/2023, 1:37 PM  Available via Epic secure chat 7am-7pm After 7 pm, please refer to night coverage provider listed on amion.

## 2023-12-16 NOTE — Plan of Care (Signed)

## 2023-12-16 NOTE — Consult Note (Incomplete)
 Palliative Care Consult Note                                  Date: 12/16/2023   Patient Name: Gilbert Morse  DOB: 05-14-1931  MRN: [id number]  Age / Sex: 89 y.o., male  PCP: Charolet Cope Chales [place], MD Referring Physician: [name], MD  Reason for Consultation: Establishing goals of care  HPI/Patient Profile: 89 y.o. male  with past medical history of dementia, hearing loss, vitreous hemorrhage, hypertension, hypothyroidism, and BPH who presented to the ED on 12/14/2023 after a fall at home.  CT lumbar spine showed irregularity of the anterior cortex of S1 which may reflect mildly displaced fracture.  Palliative Medicine has been consulted for goals of care  Subjective:   Extensive chart review has been completed including labs, vital signs, imaging, progress/consult notes, orders, medications and available advance directive documents.   Patient assessed at bedside. He is oriented to self and able to communicate basic needs. He reports lower back pain.   I met with his daughter/Patricia at bedside  to discuss diagnosis, prognosis, GOC. disposition, and options.  Patient is enrolled in the outpatient palliative care program through [facility], as well as the [facility] program receiving PT and care aide services.   I re-introduced Palliative Medicine as specialized medical care for people living with serious illness. It focuses on providing relief from the symptoms and stress of a serious illness.   Created space and opportunity for daughter to express thoughts and feelings regarding current medical situation. Values and goals of care important were attempted to be elicited.  Social History/family support: Wife/[name] and 2 daughters. Neither daughter lives locally in [place].   Functional Status: Patient lives at home with his wife/[name]. In addition to outpatient palliative services, they have private duty care 4  days/week for 3 hours/day, to assist with driving them to appointments, groceries, housework, etc.   At baseline, patient is ambulatory with a walker. He needs assistance with ADLs.    GOC Discussion: We discussed patient's current illness and what it means in the larger context of his ongoing co-morbidities. Current clinical status was reviewed.  Discussed the natural trajectory of dementia as a progressive and noncurable disease.  We discussed the difference between outpatient palliative and hospice services. Patient is likely not hospice eligible at this time. Explained that the outpatient palliative team can help determine when to make that transition.   A discussion was had today regarding advanced directives. Daughter thought patient had AD documents, but has not been able to find them. The MOST form was introduced and discussed.   Discussed code status. I encouraged consideration of DNR/DNI status and provided education on evidence-based poor outcomes in similar hospitalized patients, as the cause of cardiac arrest would likely be associated with advanced illness rather than a reversible condition. Daughter states patient has previously expressed he wants to be resuscitated, but would not want to be on life support long term if he were not improving.  Questions/concerns addressed. "Hard choice" book and PMT contact info provided.    Review of Systems  Musculoskeletal:  Positive for back pain.    Objective:   Primary Diagnoses: Present on Admission:  Ambulatory dysfunction   Physical Exam Vitals reviewed.  Constitutional:      General: He is not in acute distress.    Comments: Frail and chronically ill-appearing  HENT:     Head:  Comments: Hard of hearing Pulmonary:     Effort: Pulmonary effort is normal.  Neurological:     Mental Status: He is alert.     Comments: Oriented to self  Psychiatric:        Cognition and Memory: Cognition is impaired. Memory is impaired.      Palliative Assessment/Data: PPS 40-50%     Assessment & Plan:   SUMMARY OF RECOMMENDATIONS   Continue current supportive interventions Goal is for rehab to improve functional status Continue outpatient palliative at discharge PMT will continue to follow  Primary Decision Maker: NEXT OF KIN  Code Status/Advance Care Planning: Full code for now with ongoing discussion  Symptom Management:  Lidocaine  5% patch to lower back Hydrocodone /acetaminophen  5-325 mg, 1 tablet every 4 hours as needed for pain  Prognosis:  Unable to determine  Discharge Planning:  Skilled Nursing Facility for rehab with Palliative care service follow-up     Thank you for allowing us  to participate in the care of SHOICHI VANCOTT   Time Total: 75 minutes  Detailed review of medical records (labs, imaging, vital signs), medically appropriate exam, discussed with treatment team, counseling and education to patient, family, & staff, documenting clinical information, medication management, coordination of care.   Signed by: Maisie Scotland, NP Palliative Medicine Team  Team Phone # [phone]  For individual providers, please see AMION

## 2023-12-16 NOTE — Progress Notes (Addendum)
 Initial Nutrition Assessment  DOCUMENTATION CODES:   Severe malnutrition in context of chronic illness  INTERVENTION:   Encourage PO intake  House trays - Encouraged family to still order meals for patient if able Feeding assistance Ensure Enlive po BID, each supplement provides 350 kcal and 20 grams of protein. Magic cup TID with meals, each supplement provides 290 kcal and 9 grams of protein - prefers vanilla  Monitor magnesium, potassium, and phosphorus BID for at least 3 days, MD to replete as needed, as pt is at risk for refeeding syndrome MVI with minerals daily  100 mg Thimaine x 5 days  Provided Juven packet   NUTRITION DIAGNOSIS:   Severe Malnutrition related to chronic illness as evidenced by meal completion < 50%, severe muscle depletion, severe fat depletion.  GOAL:   Patient will meet greater than or equal to 90% of their needs   MONITOR:   PO intake, Labs, Supplement acceptance  REASON FOR ASSESSMENT:   Consult Assessment of nutrition requirement/status  ASSESSMENT:  89 y.o Male who presented after a fall from home. No loss of consciousness, does have left shoulder pain. PMH of hearing loss, vitreous hemorrhage, HTN, HLD, BPH, recurrent falls, Alzheimer's diease (per PCP note).  4/30 - Dysphagia 1 diet, thin liquids  5/1 - MBS peformed, Regular diet, thin liquids    Patient resting on visit, step-daughter Devra Fontana at bedside, who was able to provide nutritional history.   Patient lives at home with his wife and is a part of palliative care program that has home health services and physical therapy to provide care. Patient is able to ambulate with a walker at home. Per step daughter he has had recurrent falls recently. She does state he has always had a good appetite and loves food. He typically only eats 2 1/2 meals at home because he does not wake up until 11 or 12 in the afternoon. He does also drink 1 Ensure per day.   Reviewed weight history and weight  seems stable however step-daughter endorses him losing weight. On exam patient presents with chronic lower extremity fluid that is difficult to access wasting. However, he does show severe muscle and fat wasting in his other areas.   Since admission step-daughter reports he has only had an egg salad sandwich. Did not have breakfast this morning due being asleep. Step-daughter has been ordering his meals for him and providing feeding assistance. Made nursing aware he needs feeding assistance.  Encourage small frequent meals and emphasized SLP's recommendations of small bites/sips with full supervision. RD provided Juven packet and answered any questions family had. Plan for SNF.  Admit weight: 82.4 kg  Current weight: 83 kg    Average Meal Intake: 5/1-5/2: 18% intake x 2 recorded meals  Nutritionally Relevant Medications: Scheduled Meds:  feeding supplement  237 mL Oral BID BM   finasteride   5 mg Oral QHS   heparin   5,000 Units Subcutaneous Q12H   levothyroxine   25 mcg Oral Q0600   multivitamin with minerals  1 tablet Oral Daily   nutrition supplement (JUVEN)  1 packet Oral BID BM   sodium chloride  flush  3 mL Intravenous Q12H   tamsulosin   0.4 mg Oral QHS   thiamine   100 mg Oral Daily   Labs Reviewed: BUN 34, Calcium 8.4 AST 45 GFR 57 Total protein 6 No CBG No A1C    NUTRITION - FOCUSED PHYSICAL EXAM:  Flowsheet Row Most Recent Value  Orbital Region Severe depletion  Upper Arm Region  Moderate depletion  Thoracic and Lumbar Region Moderate depletion  Buccal Region Severe depletion  Temple Region Severe depletion  Clavicle Bone Region Severe depletion  Clavicle and Acromion Bone Region Severe depletion  Scapular Bone Region Severe depletion  Dorsal Hand Unable to assess  [Mitts]  Patellar Region Unable to assess  [Fluid]  Anterior Thigh Region Unable to assess  [Fluid]  Posterior Calf Region Unable to assess  [Fluid]  Edema (RD Assessment) Moderate  Hair Reviewed  Eyes  Reviewed  Mouth Reviewed  Skin Reviewed  [Bruises, and scabs aorund body , mostly on legs]  Nails Reviewed       Diet Order:   Diet Order             Diet regular Room service appropriate? No; Fluid consistency: Thin  Diet effective now                   EDUCATION NEEDS:   Not appropriate for education at this time  Skin:  Skin Assessment: Skin Integrity Issues: Skin Integrity Issues:: Other (Comment) Other: Non-pressure wound Right shoulder, wounds/bruises on legs  Last BM:  PTA  Height:   Ht Readings from Last 1 Encounters:  06/02/23 6\' 2"  (1.88 m)    Weight:   Wt Readings from Last 1 Encounters:  12/16/23 83 kg    Ideal Body Weight:  86.4 kg  BMI:  Body mass index is 23.49 kg/m.  Estimated Nutritional Needs:   Kcal:  1800-2000 kcal  Protein:  90-110 gm  Fluid:  >1.8L/day   Frederik Jansky, RD Registered Dietitian  See Amion for more information

## 2023-12-16 NOTE — Evaluation (Signed)
 Occupational Therapy Evaluation Patient Details Name: Gilbert Morse MRN: [id number] DOB: 12-Dec-1930 Today's Date: 12/16/2023   History of Present Illness   89 y.o. male admitted 12/14/23 after fall at home with elevated Ddimer and no fx. PE and DVT (-). PMhx: hearing loss, HTN, hypothyroidism, BPH, Lt TKA     Clinical Impressions Pt presents with decline in function and safety with ADLs and ADL mobility with impaired strength, balance and endurance, pt with hx of HOH, visual impairments and dementia. Pt's daughter reports that PTA pt lives at home with his wife required some assist with ADLs, used RW with schuffling gait for about 20-55ft and has PCAfor assist at home. Pt currently requires max A for rolling in bed unable to sit EOB due to pain, max  with UB ADLs and total A with LB ADLs and toileting. Pt would benefit from acute OT services to address impairments to maximize level of function and safety         If plan is discharge home, recommend the following:   A lot of help with bathing/dressing/bathroom;Two people to help with walking and/or transfers;Assist for transportation;Help with stairs or ramp for entrance     Functional Status Assessment   Patient has had a recent decline in their functional status and demonstrates the ability to make significant improvements in function in a reasonable and predictable amount of time.     Equipment Recommendations   Other (comment) (defer)     Recommendations for Other Services         Precautions/Restrictions   Precautions Precautions: Fall;Other (comment) Recall of Precautions/Restrictions: Impaired Precaution/Restrictions Comments: incontinent Restrictions Weight Bearing Restrictions Per Provider Order: No     Mobility Bed Mobility Overal bed mobility: Needs Assistance Bed Mobility: Rolling Rolling: Max assist         General bed mobility comments: max A to roll, able to reach across midline to grasp  rails    Transfers                   General transfer comment: unable      Balance                                           ADL either performed or assessed with clinical judgement   ADL Overall ADL's : Needs assistance/impaired Eating/Feeding: Minimal assistance;Sitting;Bed level   Grooming: Wash/dry hands;Wash/dry face;Supervision/safety   Upper Body Bathing: Maximum assistance   Lower Body Bathing: Total assistance   Upper Body Dressing : Maximum assistance   Lower Body Dressing: Total assistance     Toilet Transfer Details (indicate cue type and reason): unable Toileting- Clothing Manipulation and Hygiene: Total assistance;Bed level               Vision Ability to See in Adequate Light: 2 Moderately impaired Patient Visual Report: No change from baseline       Perception         Praxis         Pertinent Vitals/Pain Pain Assessment Pain Assessment: Faces Faces Pain Scale: Hurts even more Pain Location: back when rolling Pain Descriptors / Indicators: Aching, Discomfort, Grimacing, Guarding, Moaning Pain Intervention(s): Limited activity within patient's tolerance, Monitored during session, Repositioned     Extremity/Trunk Assessment Upper Extremity Assessment Upper Extremity Assessment: Generalized weakness   Lower Extremity Assessment Lower Extremity Assessment: Defer to PT evaluation  Communication Communication Communication: Impaired Factors Affecting Communication: Hearing impaired   Cognition Arousal: Alert Behavior During Therapy: WFL for tasks assessed/performed                                 Following commands: Impaired       Cueing  General Comments   Cueing Techniques: Verbal cues;Gestural cues;Tactile cues      Exercises     Shoulder Instructions      Home Living Family/patient expects to be discharged to:: Private residence Living Arrangements: Spouse/significant  other Available Help at Discharge: Family;Available 24 hours/day;Personal care attendant Type of Home: House Home Access: Stairs to enter [employer] of Steps: 4   Home Layout: One level     Bathroom Shower/Tub: Producer, television/film/video: Handicapped height     Home Equipment: Agricultural consultant (2 wheels);Cane - single point;Transport chair;BSC/3in1;Wheelchair - manual          Prior Functioning/Environment Prior Level of Function : Needs assist             Mobility Comments: walks grossly 62' with RW and shuffling gait, assist for stairs usually with medical transport chair ADLs Comments: assist for ADLs, wears adult brief    OT Problem List: Impaired balance (sitting and/or standing);Decreased strength;Decreased cognition;Pain;Decreased activity tolerance;Decreased coordination;Decreased knowledge of use of DME or AE   OT Treatment/Interventions: Self-care/ADL training;DME and/or AE instruction;Therapeutic activities;Balance training;Therapeutic exercise;Patient/family education      OT Goals(Current goals can be found in the care plan section)   Acute Rehab OT Goals Patient Stated Goal: get better OT Goal Formulation: With patient/family Time For Goal Achievement: 12/30/23 Potential to Achieve Goals: Good ADL Goals Pt Will Perform Grooming: with contact guard assist;with supervision;sitting Pt Will Perform Upper Body Dressing: with mod assist;with min assist;sitting Pt Will Transfer to Toilet: with max assist;with mod assist;with +2 assist;stand pivot transfer;bedside commode Additional ADL Goal #1: Pt will complete bed mobility mod A to it EOB in prep for seated ADL tasks   OT Frequency:  Min 2X/week    Co-evaluation              AM-PAC OT "6 Clicks" Daily Activity     Outcome Measure Help from another person eating meals?: A Little Help from another person taking care of personal grooming?: A Little Help from another person  toileting, which includes using toliet, bedpan, or urinal?: Total Help from another person bathing (including washing, rinsing, drying)?: Total Help from another person to put on and taking off regular upper body clothing?: A Lot Help from another person to put on and taking off regular lower body clothing?: Total 6 Click Score: 11   End of Session Nurse Communication: Mobility status  Activity Tolerance: Patient limited by pain;Patient limited by fatigue Patient left: in bed;with call bell/phone within reach;with bed alarm set;with family/visitor present  OT Visit Diagnosis: Other abnormalities of gait and mobility (R26.89);Unsteadiness on feet (R26.81);Muscle weakness (generalized) (M62.81);History of falling (Z91.81);Pain Pain - part of body:  (back)                Time: 1610-9604 OT Time Calculation (min): 19 min Charges:  OT General Charges $OT Visit: 1 Visit OT Evaluation $OT Eval Moderate Complexity: 1 Mod    Alfred Ann 12/16/2023, 1:32 PM

## 2023-12-16 NOTE — TOC Progression Note (Signed)
 Transition of Care [facility]) - Progression Note    Patient Details  Name: Gilbert Morse MRN: [id number] Date of Birth: [birth year]  [facility]) CM/SW Contact  Jimmie Dattilio A [place], LCSW Phone Number: 12/16/2023, 10:41 AM  Clinical Narrative:     CSW sent bed offers to pt's daughter Devra Fontana with Medicare ratings via secure email. CSW provided contact information to reach back out to CSW with bed decision.    TOC will continue to follow.    Expected Discharge Plan: Skilled Nursing Facility Barriers to Discharge: Continued Medical Work up, SNF Pending bed offer, Insurance Authorization  Expected Discharge Plan and Services       Living arrangements for the past 2 months: Single Family Home                                       Social Determinants of Health (SDOH) Interventions SDOH Screenings   Food Insecurity: Low Risk  (12/06/2023)   Received from [facility]  Housing: Low Risk  (12/06/2023)   Received from [facility]  Transportation Needs: No Transportation Needs (12/06/2023)   Received from [facility]  Utilities: Low Risk  (12/06/2023)   Received from [facility]  Tobacco Use: Low Risk  (12/06/2023)   Received from [facility]    Readmission Risk Interventions     No data to display

## 2023-12-17 DIAGNOSIS — W19XXXD Unspecified fall, subsequent encounter: Secondary | ICD-10-CM | POA: Diagnosis not present

## 2023-12-17 DIAGNOSIS — E43 Unspecified severe protein-calorie malnutrition: Secondary | ICD-10-CM | POA: Insufficient documentation

## 2023-12-17 DIAGNOSIS — W19XXXA Unspecified fall, initial encounter: Secondary | ICD-10-CM | POA: Diagnosis not present

## 2023-12-17 DIAGNOSIS — Z515 Encounter for palliative care: Secondary | ICD-10-CM | POA: Diagnosis not present

## 2023-12-17 DIAGNOSIS — I10 Essential (primary) hypertension: Secondary | ICD-10-CM | POA: Diagnosis not present

## 2023-12-17 DIAGNOSIS — M25511 Pain in right shoulder: Secondary | ICD-10-CM | POA: Diagnosis not present

## 2023-12-17 DIAGNOSIS — Z7189 Other specified counseling: Secondary | ICD-10-CM | POA: Diagnosis not present

## 2023-12-17 DIAGNOSIS — R262 Difficulty in walking, not elsewhere classified: Secondary | ICD-10-CM | POA: Diagnosis not present

## 2023-12-17 LAB — CBC WITH DIFFERENTIAL/PLATELET
Abs Immature Granulocytes: 0.07 10*3/uL (ref 0.00–0.07)
Basophils Absolute: 0 10*3/uL (ref 0.0–0.1)
Basophils Relative: 0 %
Eosinophils Absolute: 0.3 10*3/uL (ref 0.0–0.5)
Eosinophils Relative: 2 %
HCT: 30.9 % — ABNORMAL LOW (ref 39.0–52.0)
Hemoglobin: 9.8 g/dL — ABNORMAL LOW (ref 13.0–17.0)
Immature Granulocytes: 1 %
Lymphocytes Relative: 11 %
Lymphs Abs: 1.2 10*3/uL (ref 0.7–4.0)
MCH: 27.1 pg (ref 26.0–34.0)
MCHC: 31.7 g/dL (ref 30.0–36.0)
MCV: 85.6 fL (ref 80.0–100.0)
Monocytes Absolute: 1.4 10*3/uL — ABNORMAL HIGH (ref 0.1–1.0)
Monocytes Relative: 14 %
Neutro Abs: 7.4 10*3/uL (ref 1.7–7.7)
Neutrophils Relative %: 72 %
Platelets: 182 10*3/uL (ref 150–400)
RBC: 3.61 MIL/uL — ABNORMAL LOW (ref 4.22–5.81)
RDW: 16.8 % — ABNORMAL HIGH (ref 11.5–15.5)
WBC: 10.3 10*3/uL (ref 4.0–10.5)
nRBC: 0 % (ref 0.0–0.2)

## 2023-12-17 LAB — URINALYSIS, ROUTINE W REFLEX MICROSCOPIC
Bacteria, UA: NONE SEEN
Bilirubin Urine: NEGATIVE
Glucose, UA: NEGATIVE mg/dL
Ketones, ur: NEGATIVE mg/dL
Nitrite: NEGATIVE
Protein, ur: 30 mg/dL — AB
RBC / HPF: >50 RBC/hpf (ref 0–5)
Specific Gravity, Urine: 1.016 (ref 1.005–1.030)
pH: 6 (ref 5.0–8.0)

## 2023-12-17 LAB — PHOSPHORUS: Phosphorus: 3.2 mg/dL (ref 2.5–4.6)

## 2023-12-17 LAB — MAGNESIUM: Magnesium: 2.3 mg/dL (ref 1.7–2.4)

## 2023-12-17 LAB — POTASSIUM: Potassium: 3.6 mmol/L (ref 3.5–5.1)

## 2023-12-17 MED ORDER — CEPHALEXIN 500 MG PO CAPS
500.0000 mg | ORAL_CAPSULE | Freq: Two times a day (BID) | ORAL | Status: DC
  Administered 2023-12-17 – 2023-12-20 (×6): 500 mg via ORAL
  Filled 2023-12-17 (×6): qty 1

## 2023-12-17 NOTE — Progress Notes (Signed)
 [name]                                                                               Gilbert Morse, is a 89 y.o. male, DOB - 12/01/1930, ZOX:[id number] Admit date - 12/14/2023    Outpatient Primary MD for the patient is Gilbert Morse, Ch[place], MD  LOS - 0  days    Brief summary   Gilbert Morse is a 89 y.o. male with past medical history  of hearing loss, vitreous hemorrhage,Hypertension, hypothyroidism, BPH, coming from home due to a fall.  Patient brought by EMS.  Patient uses a walker and hurt his left shoulder no reports of loss of consciousness.     Assessment & Plan    Assessment and Plan:  Multiple falls at home as per his daughter.  CT lumbar spine shows Irregularity of the anterior cortex of S1 which may reflect mildly displaced fracture. No significant retropulsion. Neurosurgery  consulted by EDP, recommended WBAT.  Therapy evaluations ordered.  Family is requesting short term rehab. Will wait for therapy evaluations.      Elevated d dimer Venous duplex of lower extremities IS negative for DVT.  Ct angio of the chest is negative for segmental PE.    Hypertension Optimal BP parameters. Pt refusing to get up for checking orthostatic vital signs.   Check orthostatic vital signs.     Hypothyroidism:  Resume synthroid .    BPH Continue with flomax .    Syncope CT angio ruled out PE.  Echo is unremarkable.  Telemetry unremarkable.    Leukocytosis:  Resolved.  No pneumonia.  UA ordered and pending.   Back pain. Pain control and therapy evaluations.    Estimated body mass index is 23.49 kg/m as calculated from the following:   Height as of 06/02/23: 6\' 2"  (1.88 m).   Weight as of this encounter: 83 kg.  Code Status: full code.  DVT Prophylaxis:  heparin  injection 5,000 Units Start: 12/14/23 2200   Level of Care: Level of care: Telemetry Medical Family Communication: Updated patient's daughter over the phone.   Disposition Plan:      Remains inpatient appropriate:  pending therapy evaluation.  Procedures:  None.   Consultants:   Palliative care   Antimicrobials:   Anti-infectives (From admission, onward)    None        Medications  Scheduled Meds:  feeding supplement  237 mL Oral BID BM   finasteride   5 mg Oral QHS   heparin   5,000 Units Subcutaneous Q12H   levothyroxine   25 mcg Oral Q0600   lidocaine   1 patch Transdermal Q24H   multivitamin with minerals  1 tablet Oral Daily   nutrition supplement (JUVEN)  1 packet Oral BID BM   sodium chloride  flush  3 mL Intravenous Q12H   tamsulosin   0.4 mg Oral QHS   thiamine   100 mg Oral Daily   Continuous Infusions: PRN Meds:.acetaminophen  **OR** acetaminophen     Subjective:   Gilbert Morse was seen and examined today.  Slow to respond. More alert today.   Objective:   Vitals:   12/16/23 1600 12/16/23 1930 12/17/23 0528 12/17/23 0920  BP: 134/61 127/60 136/82  127/62  Pulse: 75 76 68 65  Resp: 16 19 19 17   Temp: 98.9 F (37.2 C) 97.8 F (36.6 C) 98.5 F (36.9 C) 98 F (36.7 C)  TempSrc: Axillary Oral Oral Oral  SpO2: 97% 97% 95% 97%  Weight:        Intake/Output Summary (Last 24 hours) at 12/17/2023 1310 Last data filed at 12/17/2023 0646 Gross per 24 hour  Intake 200 ml  Output 910 ml  Net -710 ml   Filed Weights   12/15/23 0500 12/16/23 0435  Weight: 82.4 kg 83 kg     Exam General exam: Appears calm and comfortable  Respiratory system: Clear to auscultation. Respiratory effort normal. Cardiovascular system: S1 & S2 heard, RRR. No JVD,  Gastrointestinal system: Abdomen is nondistended, soft and nontender.  Central nervous system: Alert and oriented to person only.  Extremities: Symmetric 5 x 5 power. Skin: No rashes, Psychiatry: Mood & affect appropriate.       Data Reviewed:  I have personally reviewed following labs and imaging studies   CBC Lab Results  Component Value Date   WBC 10.3 12/17/2023   RBC 3.61 (L)  12/17/2023   HGB 9.8 (L) 12/17/2023   HCT 30.9 (L) 12/17/2023   MCV 85.6 12/17/2023   MCH 27.1 12/17/2023   PLT 182 12/17/2023   MCHC 31.7 12/17/2023   RDW 16.8 (H) 12/17/2023   LYMPHSABS 1.2 12/17/2023   MONOABS 1.4 (H) 12/17/2023   EOSABS 0.3 12/17/2023   BASOSABS 0.0 12/17/2023     Last metabolic panel Lab Results  Component Value Date   NA 138 12/15/2023   K 3.6 12/17/2023   CL 102 12/15/2023   CO2 24 12/15/2023   BUN 34 (H) 12/15/2023   CREATININE 1.15 12/15/2023   GLUCOSE 121 (H) 12/15/2023   GFRNONAA >60 12/15/2023   GFRAA >60 07/03/2019   CALCIUM 8.4 (L) 12/15/2023   PHOS 3.2 12/17/2023   PROT 6.0 (L) 12/15/2023   ALBUMIN 2.7 (L) 12/15/2023   BILITOT 1.1 12/15/2023   ALKPHOS 74 12/15/2023   AST 45 (H) 12/15/2023   ALT 19 12/15/2023   ANIONGAP 12 12/15/2023    CBG (last 3)  No results for input(s): "GLUCAP" in the last 72 hours.    Coagulation Profile: No results for input(s): "INR", "PROTIME" in the last 168 hours.   Radiology Studies: ECHOCARDIOGRAM COMPLETE Result Date: 12/15/2023    ECHOCARDIOGRAM REPORT   Patient Name:   Gilbert Morse Date of Exam: 12/15/2023 Medical Rec #:  [id number]        Height:       74.0 in Accession #:    [id number]       Weight:       181.7 lb Date of Birth:  08-Mar-1931         BSA:          2.086 m Patient Age:    89 years         BP:           128/60 mmHg Patient Gender: M                HR:           93 bpm. Exam Location:  Inpatient Procedure: 2D Echo, Cardiac Doppler and Color Doppler (Both Spectral and Color            Flow Doppler were utilized during procedure). Indications:    Syncope R55  History:  Patient has no prior history of Echocardiogram examinations.  Sonographer:    Terrilee Few RCS Referring Phys: Myron Asters  Sonographer Comments: Image acquisition challenging due to patient behavioral factors. and Image acquisition challenging due to uncooperative patient. IMPRESSIONS  1. There is significant  septal dyssynchony related to bundle branch block. Left ventricular ejection fraction, by estimation, is 60 to 65%. The left ventricle has normal function. The left ventricle has no regional wall motion abnormalities. There is mild concentric left ventricular hypertrophy. Left ventricular diastolic parameters are indeterminate.  2. Right ventricular systolic function is normal. The right ventricular size is normal.  3. The mitral valve is normal in structure. Trivial mitral valve regurgitation. No evidence of mitral stenosis.  4. The aortic valve is normal in structure. There is mild calcification of the aortic valve. Aortic valve regurgitation is not visualized. No aortic stenosis is present.  5. Aortic dilatation noted. There is borderline dilatation of the aortic root, measuring 38 mm.  6. The inferior vena cava is normal in size with greater than 50% respiratory variability, suggesting right atrial pressure of 3 mmHg. FINDINGS  Left Ventricle: There is significant septal dyssynchony related to bundle branch block. Left ventricular ejection fraction, by estimation, is 60 to 65%. The left ventricle has normal function. The left ventricle has no regional wall motion abnormalities. The left ventricular internal cavity size was normal in size. There is mild concentric left ventricular hypertrophy. Abnormal (paradoxical) septal motion, consistent with left bundle branch block. Left ventricular diastolic parameters are indeterminate. Right Ventricle: The right ventricular size is normal. No increase in right ventricular wall thickness. Right ventricular systolic function is normal. Left Atrium: Left atrial size was normal in size. Right Atrium: Right atrial size was normal in size. Pericardium: Trivial pericardial effusion is present. Mitral Valve: The mitral valve is normal in structure. Trivial mitral valve regurgitation. No evidence of mitral valve stenosis. Tricuspid Valve: The tricuspid valve is normal in  structure. Tricuspid valve regurgitation is trivial. No evidence of tricuspid stenosis. Aortic Valve: The aortic valve is normal in structure. There is mild calcification of the aortic valve. Aortic valve regurgitation is not visualized. No aortic stenosis is present. Aortic valve peak gradient measures 5.8 mmHg. Pulmonic Valve: The pulmonic valve was not well visualized. Pulmonic valve regurgitation is not visualized. No evidence of pulmonic stenosis. Aorta: Aortic dilatation noted. There is borderline dilatation of the aortic root, measuring 38 mm. Venous: The inferior vena cava is normal in size with greater than 50% respiratory variability, suggesting right atrial pressure of 3 mmHg. IAS/Shunts: No atrial level shunt detected by color flow Doppler.  LEFT VENTRICLE PLAX 2D LVIDd:         4.90 cm LVIDs:         3.40 cm LV PW:         1.00 cm LV IVS:        1.10 cm LVOT diam:     2.30 cm LV SV:         85 LV SV Index:   41 LVOT Area:     4.15 cm  IVC IVC diam: 1.30 cm LEFT ATRIUM         Index LA diam:    3.10 cm 1.49 cm/m  AORTIC VALVE AV Area (Vmax): 4.05 cm AV Vmax:        120.00 cm/s AV Peak Grad:   5.8 mmHg LVOT Vmax:      117.00 cm/s LVOT Vmean:     77.900  cm/s LVOT VTI:       0.205 m  AORTA Ao Root diam: 3.80 cm Ao Asc diam:  3.40 cm TRICUSPID VALVE TR Peak grad:   33.4 mmHg TR Vmax:        289.00 cm/s  SHUNTS Systemic VTI:  0.20 m Systemic Diam: 2.30 cm Jules Oar MD Electronically signed by Jules Oar MD Signature Date/Time: 12/15/2023/5:46:08 PM    Final    DG Swallowing Func-Speech Pathology Result Date: 12/15/2023 Table formatting from the original result was not included. Modified Barium Swallow Study Patient Details Name: Gilbert Morse MRN: [id number] Date of Birth: 07-27-1931 Today's Date: 12/15/2023 HPI/PMH: HPI: Gilbert Morse is a 89 y.o. male with past medical history  of hearing loss, vitreous hemorrhage,Hypertension, hypothyroidism, BPH, coming from home due to a fall.  Patient  brought by EMS.  Patient uses a walker and hurt his left shoulder no reports of loss of consciousness.  And did not hit his head per report.  Patient is a limited historian.  Patient is part of the palliative care program with other healthcare and has home health services and physical therapy and home health aide. CT chest: Bibasilar consolidations with multiple calcifications, left greater than right, most likely due to inspissated calcified small airway disease such as due to chronic aspiration. Clinical Impression: Clinical Impression: Pt presents with mild oropharyngeal dysphagia characterized by slightly impaired timing and strength. Anterior hyoid movement and epiglottic inversion are complete but due to mistiming, laryngeal closure is incomplete. This allows quick moving thin liquids to enter the laryngeal vestibule and progress to the level of the vocal folds in trace quantities (PAS 4). Pt senses frank penetration and initiates throat clearance, which is effective at clearing the majority of already trace penetrates. The amount that is left is expelled by subsequent swallows. Recommend he continue his current diet. SLP will f/u at least briefly to provide education and reinforce use of compensatory strategies. Factors that may increase risk of adverse event in presence of aspiration Gilbert Morse & Jessy Morocco 2021): Factors that may increase risk of adverse event in presence of aspiration Gilbert Morse & Jessy Morocco 2021): Limited mobility; Frail or deconditioned Recommendations/Plan: Swallowing Evaluation Recommendations Swallowing Evaluation Recommendations Recommendations: PO diet PO Diet Recommendation: Regular; Thin liquids (Level 0) Liquid Administration via: Cup; Straw Medication Administration: Whole meds with puree Supervision: Staff to assist with self-feeding; Full supervision/cueing for swallowing strategies Swallowing strategies  : Minimize environmental distractions; Slow rate; Small bites/sips; Clear throat  intermittently Postural changes: Position pt fully upright for meals; Stay upright 30-60 min after meals Oral care recommendations: Oral care BID (2x/day) Treatment Plan Treatment Plan Treatment recommendations: Therapy as outlined in treatment plan below Follow-up recommendations: Skilled nursing-short term rehab (<3 hours/day) Functional status assessment: Patient has had a recent decline in their functional status and demonstrates the ability to make significant improvements in function in a reasonable and predictable amount of time. Treatment frequency: Min 2x/week Treatment duration: 1 week Interventions: Oropharyngeal exercises; Compensatory techniques; Patient/family education; Trials of upgraded texture/liquids; Diet toleration management by SLP Recommendations Recommendations for follow up therapy are one component of a multi-disciplinary discharge planning process, led by the attending physician.  Recommendations may be updated based on patient status, additional functional criteria and insurance authorization. Assessment: Orofacial Exam: Orofacial Exam Oral Cavity: Oral Hygiene: WFL Oral Cavity - Dentition: Adequate natural dentition Orofacial Anatomy: WFL Oral Motor/Sensory Function: WFL Anatomy: Anatomy: Suspected cervical osteophytes Boluses Administered: Boluses Administered Boluses Administered: Thin liquids (Level 0); Mildly thick liquids (  Level 2, nectar thick); Moderately thick liquids (Level 3, honey thick); Puree; Solid  Oral Impairment Domain: Oral Impairment Domain Lip Closure: No labial escape Tongue control during bolus hold: Cohesive bolus between tongue to palatal seal Bolus preparation/mastication: Timely and efficient chewing and mashing Bolus transport/lingual motion: Brisk tongue motion Oral residue: Trace residue lining oral structures Location of oral residue : Tongue Initiation of pharyngeal swallow : Posterior angle of the ramus  Pharyngeal Impairment Domain: Pharyngeal Impairment  Domain Soft palate elevation: No bolus between soft palate (SP)/pharyngeal wall (PW) Laryngeal elevation: Complete superior movement of thyroid cartilage with complete approximation of arytenoids to epiglottic petiole Anterior hyoid excursion: Complete anterior movement Epiglottic movement: Complete inversion Laryngeal vestibule closure: Complete, no air/contrast in laryngeal vestibule Pharyngeal stripping wave : Present - complete Pharyngeal contraction (A/P view only): N/A Pharyngoesophageal segment opening: Complete distension and complete duration, no obstruction of flow Tongue base retraction: Trace column of contrast or air between tongue base and PPW Pharyngeal residue: Trace residue within or on pharyngeal structures Location of pharyngeal residue: Tongue base; Valleculae  Esophageal Impairment Domain: No data recorded Pill: No data recorded Penetration/Aspiration Scale Score: Penetration/Aspiration Scale Score 1.  Material does not enter airway: Mildly thick liquids (Level 2, nectar thick); Moderately thick liquids (Level 3, honey thick); Puree; Solid 5.  Material enters airway, CONTACTS cords and not ejected out: Thin liquids (Level 0) Compensatory Strategies: Compensatory Strategies Compensatory strategies: Yes Straw: Effective; Ineffective Effective Straw: Mildly thick liquid (Level 2, nectar thick) Ineffective Straw: Thin liquid (Level 0) Chin tuck: Effective Effective Chin Tuck: Thin liquid (Level 0)   General Information: Caregiver present: No  Diet Prior to this Study: Regular; Thin liquids (Level 0)   Temperature : Normal   Respiratory Status: WFL   Supplemental O2: None (Room air)   History of Recent Intubation: No  Behavior/Cognition: Alert; Cooperative; Pleasant mood; Confused Self-Feeding Abilities: Able to self-feed Baseline vocal quality/speech: Normal Volitional Cough: Able to elicit Volitional Swallow: Able to elicit Exam Limitations: No limitations Goal Planning: Prognosis for improved  oropharyngeal function: Good Barriers to Reach Goals: Cognitive deficits No data recorded Patient/Family Stated Goal: none stated Consulted and agree with results and recommendations: Patient Pain: Pain Assessment Pain Assessment: Faces Faces Pain Scale: 8 Breathing: 0 Negative Vocalization: 1 Facial Expression: 1 Body Language: 1 Consolability: 0 PAINAD Score: 3 Pain Location: back when transferring Pain Descriptors / Indicators: Aching; Discomfort; Grimacing; Guarding; Moaning Pain Intervention(s): Monitored during session; Repositioned End of Session: Start Time:SLP Start Time (ACUTE ONLY): 1400 Stop Time: SLP Stop Time (ACUTE ONLY): 1412 Time Calculation:SLP Time Calculation (min) (ACUTE ONLY): 12 min Charges: SLP Evaluations $ SLP Speech Visit: 1 Visit SLP Evaluations $BSS Swallow: 1 Procedure $MBS Swallow: 1 Procedure SLP visit diagnosis: SLP Visit Diagnosis: Dysphagia, oropharyngeal phase (R13.12) Past Medical History: Past Medical History: Diagnosis Date  Arthritis   Enlarged prostate   Falls 06/27/2019  last fall was 2 weeks ago   Heart murmur   History of kidney stones   only one as a young man , passed independently   HTN (hypertension)  Past Surgical History: Past Surgical History: Procedure Laterality Date  BACK SURGERY    total 6 surgeries  . inluding discectomy, fusion with harvest from right hip", rods in place   TOTAL KNEE ARTHROPLASTY Left 07/02/2019  Procedure: Left Knee Arthroplasty;  Surgeon: Wendolyn Hamburger, MD;  Location: [facility];  Service: Orthopedics;  Laterality: Left; Amil Kale, M.A., CCC-SLP Speech Language Pathology, Acute Rehabilitation Services Secure Chat preferred  [phone] 12/15/2023, 2:45 PM      [name] M.D. [name] 12/17/2023, 1:10 PM  Available via Epic secure chat 7am-7pm After 7 pm, please refer to night coverage provider listed on amion.

## 2023-12-17 NOTE — Consult Note (Signed)
 WOC Nurse Consult Note: Reason for Consult: left buttock Noted to have right buttock, right shoulder wounds Wound type: Right shoulder skin tear, xeroform and top with foam. Flap in place; change every other day Deept Tissue Pressure Injury; left buttock; dark purple non blanchable tissue, intact tissue; single layer of xeroform, top with foam. Change every other day Stage 3 Pressure Injury: clean and pink; cover with single layer of xeroform, top with foam, change every other day Unstageable Pressure Injury; posterior testicle; 100% yellow; cover with single layer of xeroform change every other day Pressure Injury POA: Yes Measurement:: see nursing flow sheets Wound bed: see above  Drainage (amount, consistency, odor) see nursing flow sheets Periwound: intact  Dressing procedure/placement/frequency: Single layer of xerform to the testicles, buttocks and right shoulder, cover with foam.  Low air loss mattress for moisture management and pressure redistribution    Re consult if needed, will not follow at this time. Thanks  [employer], RN,CWOCN, CNS, CWON-AP [phone])

## 2023-12-17 NOTE — Progress Notes (Signed)
 Palliative Medicine Progress Note   Patient Name: Gilbert Morse       Date: 12/17/2023 DOB: Mar 02, 1931  Age: 89 y.o. MRN#: [id number] Attending Physician: [name], MD Primary Care Physician: [name], MD Admit Date: 12/14/2023   HPI/Patient Profile: 89 y.o. male  with past medical history of dementia, hearing loss, vitreous hemorrhage, hypertension, hypothyroidism, and BPH who presented to the ED on 12/14/2023 after a fall at home.  CT lumbar spine showed irregularity of the anterior cortex of S1 which may reflect mildly displaced fracture.   Palliative Medicine has been consulted for goals of care    Subjective: Chart reviewed, update received from RN, patient assessed at bedside. He is confused; but in no acute distress.   I spoke with daughter/Gilbert Morse at bedside.  She request discontinuing of opioid pain medication due to possible oversedation.    We reviewed patient's current  medical situation including dementia and poor nutritional status (albumin 2.7).  Daughter reports that patient has a good appetiteand feels that his nutritional status will improve with continued encouragement to eat.  She also indicates family is accepting if patient's nutritional status continues to decline despite ongoing encouragement.  We reviewed code status and MOST form.  Daughter seems to indicate she would agree DNR status is appropriate, but reports needing more time to consider and is open to ongoing discussion with outpatient palliative team.   Objective:  Physical Exam Vitals reviewed.  Constitutional:      General: He is not in acute distress.    Comments: Frail and chronically ill-appearing  HENT:     Head:     Comments: Hard of hearing Pulmonary:     Effort: Pulmonary effort is  normal.  Neurological:     Mental Status: He is alert.     Comments: Oriented to self  Psychiatric:        Cognition and Memory: Cognition is impaired. Memory is impaired.             Palliative Medicine Assessment & Plan   Assessment: Principal Problem:   Ambulatory dysfunction Active Problems:   Protein-calorie malnutrition, severe    Recommendations/Plan: Continue current supportive interventions Goal is for rehab to improve functional status Continue outpatient palliative at discharge PMT will continue to follow   Primary Decision Maker: NEXT OF KIN -  wife and daughter/Gilbert Morse  Code Status: Full code  Symptom Management:  Lidocaine  5% patch to lower back D/C Hydrocodone /acetaminophen  and IV morphine     Prognosis:  Unable to determine  Discharge Planning: Skilled Nursing Facility for rehab with Palliative care service follow-up   Thank you for allowing the Palliative Medicine Team to assist in the care of this patient.   Time: 55 minutes   Wynetta Heckle, NP   Please contact Palliative Medicine Team phone at [phone] for questions and concerns.  For individual providers, please see AMION.

## 2023-12-17 NOTE — Plan of Care (Signed)

## 2023-12-17 NOTE — Progress Notes (Addendum)
 Heel mepilex placed on R/L heel (Blanchable redness noted, bilateral)  Heels elevated off bed with pillow. Blister noted on testicle, MD informed. See media Updated photo of buttock placed into media.WOC consult placed to assess buttock area.  R shoulder mepilex changed.   Pt has refused mobility most of day. Pt/family member @ bedside expressed pt has hx of multiple back surgeries and endorses recent mild fracture of S1. Due to this pt declines to allow RN to offload right/left side q2hrs.  **Pt becomes mildly physical with staff when attempting to turn  RN will consider involving PT to see if there are any modifications that can be made to reduce lumbar pain when turning offloading R/L side with pillow. Pt endorses pillow causes significant pain, possibly due to malalignment  RN also spoke briefly with Palliative. Per family member, pain management with Vicodin and/or morphine  and potentially other narcotics cause increased cognitive impairment and somnolence. Prefers tylenol  and other non-narcotic modalities.  UA collected and sent to lab.

## 2023-12-18 DIAGNOSIS — I10 Essential (primary) hypertension: Secondary | ICD-10-CM | POA: Diagnosis not present

## 2023-12-18 DIAGNOSIS — R262 Difficulty in walking, not elsewhere classified: Secondary | ICD-10-CM | POA: Diagnosis not present

## 2023-12-18 DIAGNOSIS — W19XXXD Unspecified fall, subsequent encounter: Secondary | ICD-10-CM | POA: Diagnosis not present

## 2023-12-18 DIAGNOSIS — M25511 Pain in right shoulder: Secondary | ICD-10-CM | POA: Diagnosis not present

## 2023-12-18 LAB — CBC WITH DIFFERENTIAL/PLATELET
Abs Immature Granulocytes: 0.1 10*3/uL — ABNORMAL HIGH (ref 0.00–0.07)
Basophils Absolute: 0 10*3/uL (ref 0.0–0.1)
Basophils Relative: 0 %
Eosinophils Absolute: 0.2 10*3/uL (ref 0.0–0.5)
Eosinophils Relative: 2 %
HCT: 35.1 % — ABNORMAL LOW (ref 39.0–52.0)
Hemoglobin: 11.2 g/dL — ABNORMAL LOW (ref 13.0–17.0)
Immature Granulocytes: 1 %
Lymphocytes Relative: 9 %
Lymphs Abs: 1 10*3/uL (ref 0.7–4.0)
MCH: 27.1 pg (ref 26.0–34.0)
MCHC: 31.9 g/dL (ref 30.0–36.0)
MCV: 85 fL (ref 80.0–100.0)
Monocytes Absolute: 1.5 10*3/uL — ABNORMAL HIGH (ref 0.1–1.0)
Monocytes Relative: 14 %
Neutro Abs: 8.4 10*3/uL — ABNORMAL HIGH (ref 1.7–7.7)
Neutrophils Relative %: 74 %
Platelets: 210 10*3/uL (ref 150–400)
RBC: 4.13 MIL/uL — ABNORMAL LOW (ref 4.22–5.81)
RDW: 16.6 % — ABNORMAL HIGH (ref 11.5–15.5)
WBC: 11.2 10*3/uL — ABNORMAL HIGH (ref 4.0–10.5)
nRBC: 0 % (ref 0.0–0.2)

## 2023-12-18 LAB — BASIC METABOLIC PANEL WITH GFR
Anion gap: 8 (ref 5–15)
BUN: 51 mg/dL — ABNORMAL HIGH (ref 8–23)
CO2: 26 mmol/L (ref 22–32)
Calcium: 8.6 mg/dL — ABNORMAL LOW (ref 8.9–10.3)
Chloride: 106 mmol/L (ref 98–111)
Creatinine, Ser: 1.21 mg/dL (ref 0.61–1.24)
GFR, Estimated: 57 mL/min — ABNORMAL LOW (ref >60)
Glucose, Bld: 117 mg/dL — ABNORMAL HIGH (ref 70–99)
Potassium: 3.7 mmol/L (ref 3.5–5.1)
Sodium: 140 mmol/L (ref 135–145)

## 2023-12-18 LAB — MAGNESIUM: Magnesium: 2.3 mg/dL (ref 1.7–2.4)

## 2023-12-18 LAB — PHOSPHORUS: Phosphorus: 3.1 mg/dL (ref 2.5–4.6)

## 2023-12-18 MED ORDER — BISACODYL 10 MG RE SUPP
10.0000 mg | Freq: Once | RECTAL | Status: AC
  Administered 2023-12-18: 10 mg via RECTAL
  Filled 2023-12-18: qty 1

## 2023-12-18 NOTE — Consult Note (Signed)
 WOC consulted 5/3; see notes and wound care orders  Lacey Wallman [facility], CNS, CWON-AP [phone]

## 2023-12-18 NOTE — Progress Notes (Signed)
 [name]                                                                               Gilbert Morse, is a 89 y.o. male, DOB - [birth year], ZOX:[id number] Admit date - 12/14/2023    Outpatient Primary MD for the patient is Gilbert Morse, Chales [place], MD  LOS - 0  days    Brief summary   Gilbert Morse is a 89 y.o. male with past medical history  of hearing loss, vitreous hemorrhage,Hypertension, hypothyroidism, BPH, coming from home due to a fall.  Patient brought by EMS.  Patient uses a walker and hurt his left shoulder no reports of loss of consciousness.     Assessment & Plan    Assessment and Plan:  Multiple falls at home as per his daughter.  CT lumbar spine shows Irregularity of the anterior cortex of S1 which may reflect mildly displaced fracture. No significant retropulsion. Neurosurgery  consulted by EDP, recommended WBAT.  Therapy evaluations ordered.  Family is requesting short term rehab.      Elevated d dimer Venous duplex of lower extremities IS negative for DVT.  Ct angio of the chest is negative for segmental PE.    Hypertension Optimal BP parameters. Orthostatic vital signs negative.   Dementia:  No agitation.   Hypothyroidism:  Resume synthroid .    BPH Continue with flomax .    Syncope CT angio ruled out PE.  Echo is unremarkable.  Telemetry unremarkable.    Leukocytosis:  Resolved.  No pneumonia.  UA Shows leukocytes.  Empirically started on keflex.   Back pain. Pain control and therapy evaluations.    Blister on the testicle. : Added keflex.    Pressure Injury  Pressure Injury 12/17/23 Buttocks Right;Left Deep Tissue Pressure Injury - Purple or maroon localized area of discolored intact skin or blood-filled blister due to damage of underlying soft tissue from pressure and/or shear. (Active)  12/17/23 1600  Location: Buttocks  Location Orientation: Right;Left  Staging: Deep Tissue Pressure Injury - Purple or maroon  localized area of discolored intact skin or blood-filled blister due to damage of underlying soft tissue from pressure and/or shear.  Wound Description (Comments):   Present on Admission:     Present on admission.    Estimated body mass index is 23.41 kg/m as calculated from the following:   Height as of 06/02/23: 6\' 2"  (1.88 m).   Weight as of this encounter: 82.7 kg.  Code Status: full code.  DVT Prophylaxis:  heparin  injection 5,000 Units Start: 12/14/23 2200   Level of Care: Level of care: Telemetry Medical Family Communication: Updated patient's daughter in the room.   Disposition Plan:     Remains inpatient appropriate:  SNF placement.   Procedures:  None.   Consultants:   Palliative care   Antimicrobials:   Anti-infectives (From admission, onward)    Start     Dose/Rate Route Frequency Ordered Stop   12/17/23 2200  cephALEXin (KEFLEX) capsule 500 mg        500 mg Oral Every 12 hours 12/17/23 1710          Medications  Scheduled Meds:  cephALEXin  500  mg Oral Q12H   feeding supplement  237 mL Oral BID BM   finasteride   5 mg Oral QHS   heparin   5,000 Units Subcutaneous Q12H   levothyroxine   25 mcg Oral Q0600   lidocaine   1 patch Transdermal Q24H   multivitamin with minerals  1 tablet Oral Daily   nutrition supplement (JUVEN)  1 packet Oral BID BM   sodium chloride  flush  3 mL Intravenous Q12H   tamsulosin   0.4 mg Oral QHS   thiamine   100 mg Oral Daily   Continuous Infusions: PRN Meds:.acetaminophen  **OR** acetaminophen     Subjective:   Gilbert Morse was seen and examined today.  Very confused.   Objective:   Vitals:   12/17/23 1937 12/18/23 0352 12/18/23 0500 12/18/23 0800  BP: (!) 141/50 135/60  (!) 151/66  Pulse: 80 71  74  Resp: 16 18  16   Temp: 98.8 F (37.1 C) 97.7 F (36.5 C)  99.8 F (37.7 C)  TempSrc: Oral Oral  Oral  SpO2: 94% 96%  100%  Weight:   82.7 kg     Intake/Output Summary (Last 24 hours) at 12/18/2023 1616 Last  data filed at 12/18/2023 0419 Gross per 24 hour  Intake 570 ml  Output 550 ml  Net 20 ml   Filed Weights   12/15/23 0500 12/16/23 0435 12/18/23 0500  Weight: 82.4 kg 83 kg 82.7 kg     Exam General exam: Appears calm and comfortable  Respiratory system: Clear to auscultation. Respiratory effort normal. Cardiovascular system: S1 & S2 heard, RRR. No JVD, Gastrointestinal system: Abdomen is nondistended, soft and nontender. Central nervous system: Alert and oriented to person only, confused.  Extremities:  no pedal edema.  Skin: No rashes,  Psychiatry: no agitation.     Data Reviewed:  I have personally reviewed following labs and imaging studies   CBC Lab Results  Component Value Date   WBC 11.2 (H) 12/18/2023   RBC 4.13 (L) 12/18/2023   HGB 11.2 (L) 12/18/2023   HCT 35.1 (L) 12/18/2023   MCV 85.0 12/18/2023   MCH 27.1 12/18/2023   PLT 210 12/18/2023   MCHC 31.9 12/18/2023   RDW 16.6 (H) 12/18/2023   LYMPHSABS 1.0 12/18/2023   MONOABS 1.5 (H) 12/18/2023   EOSABS 0.2 12/18/2023   BASOSABS 0.0 12/18/2023     Last metabolic panel Lab Results  Component Value Date   NA 140 12/18/2023   K 3.7 12/18/2023   CL 106 12/18/2023   CO2 26 12/18/2023   BUN 51 (H) 12/18/2023   CREATININE 1.21 12/18/2023   GLUCOSE 117 (H) 12/18/2023   GFRNONAA 57 (L) 12/18/2023   GFRAA >60 07/03/2019   CALCIUM 8.6 (L) 12/18/2023   PHOS 3.1 12/18/2023   PROT 6.0 (L) 12/15/2023   ALBUMIN 2.7 (L) 12/15/2023   BILITOT 1.1 12/15/2023   ALKPHOS 74 12/15/2023   AST 45 (H) 12/15/2023   ALT 19 12/15/2023   ANIONGAP 8 12/18/2023    CBG (last 3)  No results for input(s): "GLUCAP" in the last 72 hours.    Coagulation Profile: No results for input(s): "INR", "PROTIME" in the last 168 hours.   Radiology Studies: No results found.      [name] M.D. [name] 12/18/2023, 4:16 PM  Available via Epic secure chat 7am-7pm After 7 pm, please refer to night coverage provider  listed on amion.

## 2023-12-18 NOTE — Plan of Care (Signed)

## 2023-12-18 NOTE — Progress Notes (Signed)
 Patient had no urine output during shift, Bladder scanned patient and volume was >332, Notified provider, Order was placed to put in foley Cath.

## 2023-12-19 DIAGNOSIS — W19XXXA Unspecified fall, initial encounter: Secondary | ICD-10-CM | POA: Diagnosis not present

## 2023-12-19 DIAGNOSIS — Z7189 Other specified counseling: Secondary | ICD-10-CM | POA: Diagnosis not present

## 2023-12-19 DIAGNOSIS — F03911 Unspecified dementia, unspecified severity, with agitation: Secondary | ICD-10-CM | POA: Diagnosis not present

## 2023-12-19 DIAGNOSIS — M25511 Pain in right shoulder: Secondary | ICD-10-CM | POA: Diagnosis not present

## 2023-12-19 DIAGNOSIS — W19XXXD Unspecified fall, subsequent encounter: Secondary | ICD-10-CM | POA: Diagnosis not present

## 2023-12-19 DIAGNOSIS — R262 Difficulty in walking, not elsewhere classified: Secondary | ICD-10-CM | POA: Diagnosis not present

## 2023-12-19 DIAGNOSIS — I10 Essential (primary) hypertension: Secondary | ICD-10-CM | POA: Diagnosis not present

## 2023-12-19 LAB — URINE CULTURE: Culture: NO GROWTH

## 2023-12-19 LAB — MAGNESIUM: Magnesium: 2.5 mg/dL — ABNORMAL HIGH (ref 1.7–2.4)

## 2023-12-19 LAB — PHOSPHORUS: Phosphorus: 3.1 mg/dL (ref 2.5–4.6)

## 2023-12-19 MED ORDER — TRAZODONE HCL 50 MG PO TABS
50.0000 mg | ORAL_TABLET | Freq: Four times a day (QID) | ORAL | Status: DC | PRN
  Administered 2023-12-19: 50 mg via ORAL
  Filled 2023-12-19: qty 1

## 2023-12-19 MED ORDER — CHLORHEXIDINE GLUCONATE CLOTH 2 % EX PADS
6.0000 | MEDICATED_PAD | Freq: Every day | CUTANEOUS | Status: DC
  Administered 2023-12-19 – 2023-12-23 (×5): 6 via TOPICAL

## 2023-12-19 NOTE — Progress Notes (Signed)
 Palliative Medicine Progress Note   Patient Name: Gilbert Morse       Date: 12/19/2023 DOB: [birth year]  Age: 89 y.o. MRN#: [id number] Attending Physician: [name], MD Primary Care Physician: [name], MD Admit Date: 12/14/2023   HPI/Patient Profile: 89 y.o. male  with past medical history of dementia, hearing loss, vitreous hemorrhage, hypertension, hypothyroidism, and BPH who presented to the ED on 12/14/2023 after a fall at home.  CT lumbar spine showed irregularity of the anterior cortex of S1 which may reflect mildly displaced fracture.   Palliative Medicine has been consulted for goals of care    Subjective: Chart reviewed and patient assessed at bedside. He is confused; but in no acute distress. Soft restraints in place. No visitors present.  I spoke with daughter/Patricia by phone after receiving update that she wished to discuss MOST form and symptom management.  She wonders if there is another medication for his anxiety and restlessness that would not make him oversedated. Risks and benefits of trazodone were discussed, recognizing that he will still experience drowsiness. She is agreeable to trying this. Spoke with RN regarding preference to use only at night but availability for severe agitation during the day if necessary.  Daughters are both out of town and patient's wife also has dementia, but they would like to prepare for SNF placement with a MOST form if possible. I explained the option of electronic completion and Devra Fontana wants to speak with her family about this. I shared that I will be off service tomorrow but that my colleague Armin Landing will be back on service Wednesday. She understands patient's tenuous condition and that anything can happen at any time, particularly with patient's underlying comorbidities.    Questions and concerns addressed. PMT will continue to support holistically.  Objective:  Physical Exam Vitals and nursing note reviewed.  Constitutional:      General: He is not in acute distress.    Comments: Frail and chronically ill-appearing  HENT:     Head:     Comments: Hard of hearing Pulmonary:     Effort: Pulmonary effort is normal.  Neurological:     Mental Status: He is alert.     Comments: Oriented to self  Psychiatric:        Cognition and Memory: Cognition is impaired. Memory is impaired.             Palliative Medicine Assessment & Plan   Assessment: Principal Problem:   Ambulatory dysfunction Active Problems:   Protein-calorie malnutrition, severe    Recommendations/Plan: Continue current supportive interventions Goal is for rehab to improve  functional status Continue outpatient palliative at discharge PMT will continue to follow   Primary Decision Maker: NEXT OF KIN - wife and daughter/Patricia  Code Status: Full code  Symptom Management:  Lidocaine  5% patch to lower back Trazodone 50mg  Q6H PRN sleep and agitation/anxiety   Prognosis:  Unable to determine  Discharge Planning: Skilled Nursing Facility for rehab with Palliative care service follow-up   Thank you for allowing the Palliative Medicine Team to assist in the care of this patient.   Elliemae Braman P Marbin Olshefski, PA-C   Please contact Palliative Medicine Team phone at [phone] for questions and concerns.  For individual providers, please see AMION.

## 2023-12-19 NOTE — Plan of Care (Signed)

## 2023-12-19 NOTE — Progress Notes (Signed)
 [name]                                                                               Morse Choto, is a 89 y.o. male, DOB - 1931/01/02, ZOX:[id number] Admit date - 12/14/2023    Outpatient Primary MD for the patient is Charolet Cope, Chales [place], MD  LOS - 0  days    Brief summary   Gilbert Morse is a 89 y.o. male with past medical history  of hearing loss, vitreous hemorrhage,Hypertension, hypothyroidism, BPH, coming from home due to a fall.  Patient brought by EMS.  Patient uses a walker and hurt his left shoulder no reports of loss of consciousness.     Assessment & Plan    Assessment and Plan:  Multiple falls at home as per his daughter.  CT lumbar spine shows Irregularity of the anterior cortex of S1 which may reflect mildly displaced fracture. No significant retropulsion. Neurosurgery  consulted by EDP, recommended WBAT.  Therapy evaluations ordered.  Family is requesting short term rehab.      Elevated d dimer Venous duplex of lower extremities IS negative for DVT.  Ct angio of the chest is negative for segmental PE.    Hypertension Well controlled.  Orthostatic vital signs negative.   Dementia:  No agitation.   Hypothyroidism:  Resume synthroid .    BPH Continue with flomax .    Syncope CT angio ruled out PE.  Echo is unremarkable.  Telemetry unremarkable.    Leukocytosis:  Resolved.  No pneumonia.  UA Shows leukocytes.  Empirically started on keflex.   Back pain. Pain control and therapy evaluations.    Blister on the testicle., as per the family has been there for long time: Added keflex.    Pressure Injury  Pressure Injury 12/17/23 Buttocks Right;Left Deep Tissue Pressure Injury - Purple or maroon localized area of discolored intact skin or blood-filled blister due to damage of underlying soft tissue from pressure and/or shear. (Active)  12/17/23 1600  Location: Buttocks  Location Orientation: Right;Left  Staging: Deep  Tissue Pressure Injury - Purple or maroon localized area of discolored intact skin or blood-filled blister due to damage of underlying soft tissue from pressure and/or shear.  Wound Description (Comments):   Present on Admission:    Patient fell on his back at home, and had bruising on his back.  Present on admission.    Estimated body mass index is 22.87 kg/m as calculated from the following:   Height as of 06/02/23: 6\' 2"  (1.88 m).   Weight as of this encounter: 80.8 kg.  Code Status: full code.  DVT Prophylaxis:  heparin  injection 5,000 Units Start: 12/14/23 2200   Level of Care: Level of care: Telemetry Medical Family Communication: Updated patient's daughter in the room.   Disposition Plan:     Remains inpatient appropriate:  SNF placement.   Procedures:  None.   Consultants:   Palliative care   Antimicrobials:   Anti-infectives (From admission, onward)    Start     Dose/Rate Route Frequency Ordered Stop   12/17/23 2200  cephALEXin (KEFLEX) capsule 500 mg        500 mg Oral  Every 12 hours 12/17/23 1710          Medications  Scheduled Meds:  cephALEXin  500 mg Oral Q12H   Chlorhexidine  Gluconate Cloth  6 each Topical Q0600   feeding supplement  237 mL Oral BID BM   finasteride   5 mg Oral QHS   heparin   5,000 Units Subcutaneous Q12H   levothyroxine   25 mcg Oral Q0600   lidocaine   1 patch Transdermal Q24H   multivitamin with minerals  1 tablet Oral Daily   nutrition supplement (JUVEN)  1 packet Oral BID BM   sodium chloride  flush  3 mL Intravenous Q12H   tamsulosin   0.4 mg Oral QHS   thiamine   100 mg Oral Daily   Continuous Infusions: PRN Meds:.acetaminophen  **OR** acetaminophen , traZODone    Subjective:   Gilbert Morse was seen and examined today.  Lethargic. Confused.   Objective:   Vitals:   12/19/23 0410 12/19/23 0500 12/19/23 0915 12/19/23 1735  BP: (!) 121/95  (!) 144/59 125/77  Pulse: 67  70 90  Resp: 18  17 17   Temp: 97.6 F (36.4 C)      TempSrc: Oral     SpO2: (!) 89%  100% 94%  Weight:  80.8 kg      Intake/Output Summary (Last 24 hours) at 12/19/2023 1840 Last data filed at 12/19/2023 0648 Gross per 24 hour  Intake 300 ml  Output 1250 ml  Net -950 ml   Filed Weights   12/16/23 0435 12/18/23 0500 12/19/23 0500  Weight: 83 kg 82.7 kg 80.8 kg     Exam General exam: Appears calm and comfortable  Respiratory system: Clear to auscultation. Respiratory effort normal. Cardiovascular system: S1 & S2 heard, RRR. No JVD,  Gastrointestinal system: Abdomen is nondistended, soft and nontender.  Central nervous system: Alert and oriented to self, oriented to person only. Has mittens Extremities: no pedal edema.  Skin: No rashes,  Psychiatry:  Mood & affect appropriate.      Data Reviewed:  I have personally reviewed following labs and imaging studies   CBC Lab Results  Component Value Date   WBC 11.2 (H) 12/18/2023   RBC 4.13 (L) 12/18/2023   HGB 11.2 (L) 12/18/2023   HCT 35.1 (L) 12/18/2023   MCV 85.0 12/18/2023   MCH 27.1 12/18/2023   PLT 210 12/18/2023   MCHC 31.9 12/18/2023   RDW 16.6 (H) 12/18/2023   LYMPHSABS 1.0 12/18/2023   MONOABS 1.5 (H) 12/18/2023   EOSABS 0.2 12/18/2023   BASOSABS 0.0 12/18/2023     Last metabolic panel Lab Results  Component Value Date   NA 140 12/18/2023   K 3.7 12/18/2023   CL 106 12/18/2023   CO2 26 12/18/2023   BUN 51 (H) 12/18/2023   CREATININE 1.21 12/18/2023   GLUCOSE 117 (H) 12/18/2023   GFRNONAA 57 (L) 12/18/2023   GFRAA >60 07/03/2019   CALCIUM 8.6 (L) 12/18/2023   PHOS 3.1 12/19/2023   PROT 6.0 (L) 12/15/2023   ALBUMIN 2.7 (L) 12/15/2023   BILITOT 1.1 12/15/2023   ALKPHOS 74 12/15/2023   AST 45 (H) 12/15/2023   ALT 19 12/15/2023   ANIONGAP 8 12/18/2023    CBG (last 3)  No results for input(s): "GLUCAP" in the last 72 hours.    Coagulation Profile: No results for input(s): "INR", "PROTIME" in the last 168 hours.   Radiology Studies: No  results found.      [name] M.D. [name] 12/19/2023, 6:40 PM  Available via  Epic secure chat 7am-7pm After 7 pm, please refer to night coverage provider listed on amion.

## 2023-12-19 NOTE — TOC Progression Note (Signed)
 Transition of Care [facility]) - Progression Note    Patient Details  Name: HERBY BIGA MRN: [id number] Date of Birth: January 28, 1931  Transition [facility] CM/SW Contact  Jaice Lague A [place], LCSW Phone Number: 12/19/2023, 2:38 PM  Clinical Narrative:     CSW spoke with pt's daughter Devra Fontana and provided bed offers with medicare.gov ratings. She stated she would provide CSW with answer on placement after discussing with pt's family.  CSW reached out to [facility] regarding bed decision for placement.    TOC will continue to follow.    Expected Discharge Plan: Skilled Nursing Facility Barriers to Discharge: Continued Medical Work up, SNF Pending bed offer, Insurance Authorization  Expected Discharge Plan and Services       Living arrangements for the past 2 months: Single Family Home                                       Social Determinants of Health (SDOH) Interventions SDOH Screenings   Food Insecurity: Patient Unable To Answer (12/18/2023)  Housing: Patient Unable To Answer (12/18/2023)  Transportation Needs: Patient Unable To Answer (12/18/2023)  Utilities: Patient Unable To Answer (12/18/2023)  Social Connections: Unknown (12/18/2023)  Tobacco Use: Low Risk  (12/06/2023)   Received from [facility]    Readmission Risk Interventions     No data to display

## 2023-12-20 DIAGNOSIS — I10 Essential (primary) hypertension: Secondary | ICD-10-CM | POA: Diagnosis not present

## 2023-12-20 DIAGNOSIS — R262 Difficulty in walking, not elsewhere classified: Secondary | ICD-10-CM | POA: Diagnosis not present

## 2023-12-20 DIAGNOSIS — M25511 Pain in right shoulder: Secondary | ICD-10-CM | POA: Diagnosis not present

## 2023-12-20 DIAGNOSIS — W19XXXD Unspecified fall, subsequent encounter: Secondary | ICD-10-CM | POA: Diagnosis not present

## 2023-12-20 MED ORDER — LACTATED RINGERS IV SOLN: INTRAVENOUS | Status: DC

## 2023-12-20 NOTE — Progress Notes (Signed)
 [name]                                                                               Gilbert Morse, is a 89 y.o. male, DOB - Feb 18, 1931, WUJ:[id number] Admit date - 12/14/2023    Outpatient Primary MD for the patient is Charolet Cope, Chales [place], MD  LOS - 0  days    Brief summary   Gilbert Morse is a 89 y.o. male with past medical history  of hearing loss, vitreous hemorrhage,Hypertension, hypothyroidism, BPH, coming from home due to a fall.  Patient brought by EMS.  Patient uses a walker and hurt his left shoulder no reports of loss of consciousness.     Assessment & Plan    Assessment and Plan:  Multiple falls at home as per his daughter.  CT lumbar spine shows Irregularity of the anterior cortex of S1 which may reflect mildly displaced fracture. No significant retropulsion. Neurosurgery  consulted by EDP, recommended WBAT.  Therapy evaluations ordered.  Family is requesting short term rehab.      Elevated d dimer Venous duplex of lower extremities IS negative for DVT.  Ct angio of the chest is negative for segmental PE.    Hypertension Well controlled.   Orthostatic vital signs negative.   Dementia:  No agitation.   Hypothyroidism:  Resume synthroid .    BPH Continue with flomax .    Syncope CT angio ruled out PE.  Echo is unremarkable.  Telemetry unremarkable.    Leukocytosis:  Resolved.  No pneumonia.  UA Shows leukocytes. Empirically started on keflex but discontinued as his cultures are negative.    Back pain. Pain control and therapy evaluations.   Urinary retention:  Foley catheter placed .  Urine culture are negative.  Continue with flomax  and finasteride    Small nodule on the testicle., as per the family has been there for long time:   In view of multiple medical issues, advanced age, poor oral intake , deconditioning, palliative care consulted for goals of care.     Pressure Injury  Pressure Injury 12/17/23 Buttocks  Right;Left Deep Tissue Pressure Injury - Purple or maroon localized area of discolored intact skin or blood-filled blister due to damage of underlying soft tissue from pressure and/or shear. (Active)  12/17/23 1600  Location: Buttocks  Location Orientation: Right;Left  Staging: Deep Tissue Pressure Injury - Purple or maroon localized area of discolored intact skin or blood-filled blister due to damage of underlying soft tissue from pressure and/or shear.  Wound Description (Comments):   Present on Admission:    Patient fell on his back at home, and had bruising on his back.  Present on admission.    Estimated body mass index is 23.04 kg/m as calculated from the following:   Height as of 06/02/23: 6\' 2"  (1.88 m).   Weight as of this encounter: 81.4 kg.  Code Status: full code.  DVT Prophylaxis:  heparin  injection 5,000 Units Start: 12/14/23 2200   Level of Care: Level of care: Telemetry Medical Family Communication: Updated patient's daughter in the room.   Disposition Plan:     Remains inpatient appropriate:  SNF placement.   Procedures:  None.  Consultants:   Palliative care   Antimicrobials:   Anti-infectives (From admission, onward)    Start     Dose/Rate Route Frequency Ordered Stop   12/17/23 2200  cephALEXin (KEFLEX) capsule 500 mg        500 mg Oral Every 12 hours 12/17/23 1710          Medications  Scheduled Meds:  cephALEXin  500 mg Oral Q12H   Chlorhexidine  Gluconate Cloth  6 each Topical Q0600   feeding supplement  237 mL Oral BID BM   finasteride   5 mg Oral QHS   heparin   5,000 Units Subcutaneous Q12H   levothyroxine   25 mcg Oral Q0600   lidocaine   1 patch Transdermal Q24H   multivitamin with minerals  1 tablet Oral Daily   nutrition supplement (JUVEN)  1 packet Oral BID BM   sodium chloride  flush  3 mL Intravenous Q12H   tamsulosin   0.4 mg Oral QHS   thiamine   100 mg Oral Daily   Continuous Infusions:  lactated ringers  75 mL/hr at 12/20/23  1424   PRN Meds:.acetaminophen  **OR** acetaminophen , traZODone    Subjective:   Gilbert Morse was seen and examined today.  Confused.   Objective:   Vitals:   12/20/23 0419 12/20/23 0500 12/20/23 0739 12/20/23 1700  BP: (!) 98/59  103/62 (!) 120/56  Pulse: 78  75 76  Resp: 16     Temp: 97.7 F (36.5 C)     TempSrc: Oral     SpO2: 97%  92% 95%  Weight:  81.4 kg      Intake/Output Summary (Last 24 hours) at 12/20/2023 1849 Last data filed at 12/20/2023 1800 Gross per 24 hour  Intake 120 ml  Output 1401 ml  Net -1281 ml   Filed Weights   12/18/23 0500 12/19/23 0500 12/20/23 0500  Weight: 82.7 kg 80.8 kg 81.4 kg     Exam General exam: Appears calm and comfortable  Respiratory system: Clear to auscultation. Respiratory effort normal. Cardiovascular system: S1 & S2 heard, RRR. No JVD, Gastrointestinal system: Abdomen is nondistended, soft and nontender.  Central nervous system: Alert and oriented to person only.  Extremities: no pedal edema.  Skin: No rashes, Psychiatry:  Mood & affect appropriate.      Data Reviewed:  I have personally reviewed following labs and imaging studies   CBC Lab Results  Component Value Date   WBC 11.2 (H) 12/18/2023   RBC 4.13 (L) 12/18/2023   HGB 11.2 (L) 12/18/2023   HCT 35.1 (L) 12/18/2023   MCV 85.0 12/18/2023   MCH 27.1 12/18/2023   PLT 210 12/18/2023   MCHC 31.9 12/18/2023   RDW 16.6 (H) 12/18/2023   LYMPHSABS 1.0 12/18/2023   MONOABS 1.5 (H) 12/18/2023   EOSABS 0.2 12/18/2023   BASOSABS 0.0 12/18/2023     Last metabolic panel Lab Results  Component Value Date   NA 140 12/18/2023   K 3.7 12/18/2023   CL 106 12/18/2023   CO2 26 12/18/2023   BUN 51 (H) 12/18/2023   CREATININE 1.21 12/18/2023   GLUCOSE 117 (H) 12/18/2023   GFRNONAA 57 (L) 12/18/2023   GFRAA >60 07/03/2019   CALCIUM 8.6 (L) 12/18/2023   PHOS 3.1 12/19/2023   PROT 6.0 (L) 12/15/2023   ALBUMIN 2.7 (L) 12/15/2023   BILITOT 1.1 12/15/2023    ALKPHOS 74 12/15/2023   AST 45 (H) 12/15/2023   ALT 19 12/15/2023   ANIONGAP 8 12/18/2023    CBG (last 3)  No results for input(s): "GLUCAP" in the last 72 hours.    Coagulation Profile: No results for input(s): "INR", "PROTIME" in the last 168 hours.   Radiology Studies: No results found.      [name] M.D. [name] 12/20/2023, 6:49 PM  Available via Epic secure chat 7am-7pm After 7 pm, please refer to night coverage provider listed on amion.

## 2023-12-20 NOTE — Plan of Care (Signed)

## 2023-12-21 DIAGNOSIS — Y92009 Unspecified place in unspecified non-institutional (private) residence as the place of occurrence of the external cause: Secondary | ICD-10-CM

## 2023-12-21 DIAGNOSIS — F0394 Unspecified dementia, unspecified severity, with anxiety: Secondary | ICD-10-CM | POA: Diagnosis present

## 2023-12-21 DIAGNOSIS — R5381 Other malaise: Secondary | ICD-10-CM

## 2023-12-21 DIAGNOSIS — E43 Unspecified severe protein-calorie malnutrition: Secondary | ICD-10-CM | POA: Diagnosis present

## 2023-12-21 DIAGNOSIS — R338 Other retention of urine: Secondary | ICD-10-CM | POA: Diagnosis present

## 2023-12-21 DIAGNOSIS — R55 Syncope and collapse: Secondary | ICD-10-CM | POA: Diagnosis present

## 2023-12-21 DIAGNOSIS — Z66 Do not resuscitate: Secondary | ICD-10-CM | POA: Diagnosis not present

## 2023-12-21 DIAGNOSIS — I10 Essential (primary) hypertension: Secondary | ICD-10-CM | POA: Insufficient documentation

## 2023-12-21 DIAGNOSIS — Z515 Encounter for palliative care: Secondary | ICD-10-CM | POA: Diagnosis not present

## 2023-12-21 DIAGNOSIS — S3210XA Unspecified fracture of sacrum, initial encounter for closed fracture: Secondary | ICD-10-CM | POA: Diagnosis present

## 2023-12-21 DIAGNOSIS — R296 Repeated falls: Secondary | ICD-10-CM | POA: Diagnosis present

## 2023-12-21 DIAGNOSIS — M8589 Other specified disorders of bone density and structure, multiple sites: Secondary | ICD-10-CM | POA: Diagnosis present

## 2023-12-21 DIAGNOSIS — E860 Dehydration: Secondary | ICD-10-CM | POA: Diagnosis present

## 2023-12-21 DIAGNOSIS — M549 Dorsalgia, unspecified: Secondary | ICD-10-CM

## 2023-12-21 DIAGNOSIS — S20229A Contusion of unspecified back wall of thorax, initial encounter: Secondary | ICD-10-CM | POA: Diagnosis present

## 2023-12-21 DIAGNOSIS — F0390 Unspecified dementia without behavioral disturbance: Secondary | ICD-10-CM | POA: Diagnosis not present

## 2023-12-21 DIAGNOSIS — N400 Enlarged prostate without lower urinary tract symptoms: Secondary | ICD-10-CM | POA: Insufficient documentation

## 2023-12-21 DIAGNOSIS — Y9259 Other trade areas as the place of occurrence of the external cause: Secondary | ICD-10-CM | POA: Diagnosis not present

## 2023-12-21 DIAGNOSIS — N401 Enlarged prostate with lower urinary tract symptoms: Secondary | ICD-10-CM | POA: Diagnosis present

## 2023-12-21 DIAGNOSIS — W01190A Fall on same level from slipping, tripping and stumbling with subsequent striking against furniture, initial encounter: Secondary | ICD-10-CM | POA: Diagnosis present

## 2023-12-21 DIAGNOSIS — L89316 Pressure-induced deep tissue damage of right buttock: Secondary | ICD-10-CM | POA: Diagnosis present

## 2023-12-21 DIAGNOSIS — E870 Hyperosmolality and hypernatremia: Secondary | ICD-10-CM | POA: Diagnosis present

## 2023-12-21 DIAGNOSIS — N509 Disorder of male genital organs, unspecified: Secondary | ICD-10-CM | POA: Diagnosis present

## 2023-12-21 DIAGNOSIS — Z79890 Hormone replacement therapy (postmenopausal): Secondary | ICD-10-CM | POA: Diagnosis not present

## 2023-12-21 DIAGNOSIS — Z781 Physical restraint status: Secondary | ICD-10-CM | POA: Diagnosis not present

## 2023-12-21 DIAGNOSIS — W19XXXA Unspecified fall, initial encounter: Secondary | ICD-10-CM

## 2023-12-21 DIAGNOSIS — Z7189 Other specified counseling: Secondary | ICD-10-CM | POA: Diagnosis not present

## 2023-12-21 DIAGNOSIS — Z6823 Body mass index (BMI) 23.0-23.9, adult: Secondary | ICD-10-CM | POA: Diagnosis not present

## 2023-12-21 DIAGNOSIS — E039 Hypothyroidism, unspecified: Secondary | ICD-10-CM | POA: Diagnosis present

## 2023-12-21 DIAGNOSIS — F03911 Unspecified dementia, unspecified severity, with agitation: Secondary | ICD-10-CM | POA: Diagnosis present

## 2023-12-21 DIAGNOSIS — L89326 Pressure-induced deep tissue damage of left buttock: Secondary | ICD-10-CM | POA: Diagnosis present

## 2023-12-21 DIAGNOSIS — D72829 Elevated white blood cell count, unspecified: Secondary | ICD-10-CM | POA: Diagnosis present

## 2023-12-21 DIAGNOSIS — R7989 Other specified abnormal findings of blood chemistry: Secondary | ICD-10-CM | POA: Diagnosis present

## 2023-12-21 LAB — CBC WITH DIFFERENTIAL/PLATELET
Abs Immature Granulocytes: 0.16 10*3/uL — ABNORMAL HIGH (ref 0.00–0.07)
Basophils Absolute: 0.1 10*3/uL (ref 0.0–0.1)
Basophils Relative: 1 %
Eosinophils Absolute: 0.6 10*3/uL — ABNORMAL HIGH (ref 0.0–0.5)
Eosinophils Relative: 5 %
HCT: 32.7 % — ABNORMAL LOW (ref 39.0–52.0)
Hemoglobin: 10.4 g/dL — ABNORMAL LOW (ref 13.0–17.0)
Immature Granulocytes: 1 %
Lymphocytes Relative: 10 %
Lymphs Abs: 1.3 10*3/uL (ref 0.7–4.0)
MCH: 27.2 pg (ref 26.0–34.0)
MCHC: 31.8 g/dL (ref 30.0–36.0)
MCV: 85.4 fL (ref 80.0–100.0)
Monocytes Absolute: 1.4 10*3/uL — ABNORMAL HIGH (ref 0.1–1.0)
Monocytes Relative: 11 %
Neutro Abs: 9.3 10*3/uL — ABNORMAL HIGH (ref 1.7–7.7)
Neutrophils Relative %: 72 %
Platelets: 303 10*3/uL (ref 150–400)
RBC: 3.83 MIL/uL — ABNORMAL LOW (ref 4.22–5.81)
RDW: 17.2 % — ABNORMAL HIGH (ref 11.5–15.5)
WBC: 12.9 10*3/uL — ABNORMAL HIGH (ref 4.0–10.5)
nRBC: 0 % (ref 0.0–0.2)

## 2023-12-21 LAB — BASIC METABOLIC PANEL WITH GFR
Anion gap: 10 (ref 5–15)
BUN: 64 mg/dL — ABNORMAL HIGH (ref 8–23)
CO2: 25 mmol/L (ref 22–32)
Calcium: 8.6 mg/dL — ABNORMAL LOW (ref 8.9–10.3)
Chloride: 112 mmol/L — ABNORMAL HIGH (ref 98–111)
Creatinine, Ser: 1.25 mg/dL — ABNORMAL HIGH (ref 0.61–1.24)
GFR, Estimated: 55 mL/min — ABNORMAL LOW (ref >60)
Glucose, Bld: 117 mg/dL — ABNORMAL HIGH (ref 70–99)
Potassium: 4.3 mmol/L (ref 3.5–5.1)
Sodium: 147 mmol/L — ABNORMAL HIGH (ref 135–145)

## 2023-12-21 MED ORDER — LACTATED RINGERS IV SOLN: INTRAVENOUS | Status: AC

## 2023-12-21 NOTE — TOC Progression Note (Signed)
 Transition of Care [facility]) - Progression Note    Patient Details  Name: Gilbert Morse MRN: [id number] Date of Birth: 07-Nov-1930  Transition of Care [facility]) CM/SW Contact  Efe Fazzino A [place], LCSW Phone Number: 12/21/2023, 11:43 AM  Clinical Narrative:     CSW spoke with pt's niece Devra Fontana regarding disposition for pt. She said that pt's wife and daughter were coming today to visit with pt at bedside. She said she is aware that pt is not participating and at times refusing and knows that pt being able to go to short term rehab is contingent on participation.  Arnetta Lank that she had a loved one a [employer] care but given pt's ADL's and ambulation assistance, most likely would need a higher level of care.   CSW informed her of [facility] providing bed offer. Accepted bed at [facility]. Discussed if pt unable to rehab, then possible LTC at a SNF, out of pocket costs etc. She said that she has spoken with Mrs. Clara about that possibility already and said she "will do what they need to do" if it comes to that point.   Disposition plan pending.   TOC will continue to follow.    Expected Discharge Plan: Skilled Nursing Facility Barriers to Discharge: Continued Medical Work up, SNF Pending bed offer, Insurance Authorization  Expected Discharge Plan and Services       Living arrangements for the past 2 months: Single Family Home                                       Social Determinants of Health (SDOH) Interventions SDOH Screenings   Food Insecurity: Patient Unable To Answer (12/18/2023)  Housing: Patient Unable To Answer (12/18/2023)  Transportation Needs: Patient Unable To Answer (12/18/2023)  Utilities: Patient Unable To Answer (12/18/2023)  Social Connections: Unknown (12/18/2023)  Tobacco Use: Low Risk  (12/06/2023)   Received from [facility]    Readmission Risk Interventions     No data to display

## 2023-12-21 NOTE — Progress Notes (Signed)
 Occupational Therapy Treatment Patient Details Name: Gilbert Morse MRN: [id number] DOB: [birth year] Today's Date: 12/21/2023   History of present illness 89 y.o. male admitted 12/14/23 after fall at home with elevated Ddimer and no fx. PE and DVT (-). PMhx: hearing loss, HTN, hypothyroidism, BPH, Lt TKA   OT comments  Pt more alert this session, states his name and DOB, follows commands with incr time. Pt needing up to total A for ADLs, max A +2 for bed mobility and max +2 for transfers with [facility]. Pt able to pull up on stedy, keeps knees flexed, but stands x2 for pericare/pad change. Pt presenting with impairments listed below, will follow acutely. Patient will benefit from continued inpatient follow up therapy, <3 hours/day to maximize safety/ind with ADL/functional mobility.       If plan is discharge home, recommend the following:  A lot of help with bathing/dressing/bathroom;Two people to help with walking and/or transfers;Assist for transportation;Help with stairs or ramp for entrance   Equipment Recommendations  Other (comment) (defer)    Recommendations for Other Services PT consult    Precautions / Restrictions Precautions Precautions: Fall;Other (comment) Recall of Precautions/Restrictions: Impaired Precaution/Restrictions Comments: incontinent Restrictions Weight Bearing Restrictions Per Provider Order: No       Mobility Bed Mobility Overal bed mobility: Needs Assistance Bed Mobility: Rolling, Supine to Sit, Sit to Supine Rolling: Max assist   Supine to sit: +2 for physical assistance, Max assist Sit to supine: +2 for physical assistance, Max assist   General bed mobility comments: Assist to bring legs off of bed, elevate trunk into sitting and bring hips to EOB. Assist to lower trunk and bring legs back up into bed.    Transfers Overall transfer level: Needs assistance Equipment used: Ambulation equipment used Transfers: Sit to/from Stand Sit to Stand: +2  physical assistance, Max assist             Transfer via Lift Equipment: Stedy   Balance Overall balance assessment: Needs assistance Sitting-balance support: Bilateral upper extremity supported, Feet supported Sitting balance-Leahy Scale: Poor Sitting balance - Comments: UE support and min assist   Standing balance support: Bilateral upper extremity supported Standing balance-Leahy Scale: Zero Standing balance comment: +2 max for partial stand                           ADL either performed or assessed with clinical judgement   ADL Overall ADL's : Needs assistance/impaired Eating/Feeding: Minimal assistance;Sitting   Grooming: Minimal assistance;Sitting   Upper Body Bathing: Moderate assistance;Sitting   Lower Body Bathing: Total assistance;Sitting/lateral leans   Upper Body Dressing : Moderate assistance;Sitting   Lower Body Dressing: Total assistance;Sitting/lateral leans   Toilet Transfer: Maximal assistance;+2 for physical assistance                  Extremity/Trunk Assessment Upper Extremity Assessment Upper Extremity Assessment: Generalized weakness   Lower Extremity Assessment Lower Extremity Assessment: Defer to PT evaluation        Vision   Additional Comments: able to see "3" fingers held up by therapist in central visual field   Perception Perception Perception: Not tested   Praxis Praxis Praxis: Not tested   Communication Communication Communication: Impaired Factors Affecting Communication: Hearing impaired   Cognition Arousal: Alert Behavior During Therapy: WFL for tasks assessed/performed Cognition: History of cognitive impairments             OT - Cognition Comments: hx dementia, states his DOB  Following commands: Impaired Following commands impaired: Follows one step commands with increased time      Cueing   Cueing Techniques: Verbal cues, Gestural cues, Tactile cues  Exercises       Shoulder Instructions       General Comments VSS    Pertinent Vitals/ Pain       Pain Assessment Pain Assessment: Faces Pain Score: 6  Faces Pain Scale: Hurts even more Pain Location: back and generalized Pain Descriptors / Indicators: Grimacing, Guarding Pain Intervention(s): Limited activity within patient's tolerance, Monitored during session, Repositioned  Home Living                                          Prior Functioning/Environment              Frequency  Min 2X/week        Progress Toward Goals  OT Goals(current goals can now be found in the care plan section)  Progress towards OT goals: Progressing toward goals  Acute Rehab OT Goals Patient Stated Goal: none stated OT Goal Formulation: With patient/family Time For Goal Achievement: 12/30/23 Potential to Achieve Goals: Good ADL Goals Pt Will Perform Grooming: with contact guard assist;with supervision;sitting Pt Will Perform Upper Body Dressing: with mod assist;with min assist;sitting Pt Will Transfer to Toilet: with max assist;with mod assist;with +2 assist;stand pivot transfer;bedside commode Additional ADL Goal #1: Pt will complete bed mobility mod A to it EOB in prep for seated ADL tasks  Plan      Co-evaluation    PT/OT/SLP Co-Evaluation/Treatment: Yes Reason for Co-Treatment: For patient/therapist safety PT goals addressed during session: Mobility/safety with mobility;Balance OT goals addressed during session: ADL's and self-care;Strengthening/ROM      AM-PAC OT "6 Clicks" Daily Activity     Outcome Measure   Help from another person eating meals?: A Little Help from another person taking care of personal grooming?: A Little Help from another person toileting, which includes using toliet, bedpan, or urinal?: Total Help from another person bathing (including washing, rinsing, drying)?: Total Help from another person to put on and taking off regular upper body  clothing?: A Lot Help from another person to put on and taking off regular lower body clothing?: Total 6 Click Score: 11    End of Session    OT Visit Diagnosis: Other abnormalities of gait and mobility (R26.89);Unsteadiness on feet (R26.81);Muscle weakness (generalized) (M62.81);History of falling (Z91.81);Pain   Activity Tolerance Patient limited by pain;Patient limited by fatigue   Patient Left in bed;with call bell/phone within reach;with bed alarm set;with family/visitor present   Nurse Communication Mobility status        Time: 9562-1308 OT Time Calculation (min): 23 min  Charges: OT General Charges $OT Visit: 1 Visit OT Treatments $Therapeutic Activity: 8-22 mins  Yajayra Feldt K, OTD, OTR/L SecureChat Preferred Acute Rehab [phone]   Benedict Brain Koonce 12/21/2023, 1:36 PM

## 2023-12-21 NOTE — Progress Notes (Signed)
                                                                                                                                                                                                          Palliative Medicine Progress Note   Patient Name: Gilbert Morse       Date: 12/21/2023 DOB: Apr 18, 1931  Age: 89 y.o. MRN#: [id number] Attending Physician: [name], MD Primary Care Physician: [name], MD Admit Date: 12/14/2023  Reason for Consultation/Follow-up: {Reason for Consult:23484}  HPI/Patient Profile: 89 y.o. male  with past medical history of dementia, hearing loss, vitreous hemorrhage, hypertension, hypothyroidism, and BPH who presented to the ED on 12/14/2023 after a fall at home.  CT lumbar spine showed irregularity of the anterior cortex of S1 which may reflect mildly displaced fracture.   Palliative Medicine has been consulted for goals of care  Subjective: Chart reviewed, update received from RN, patient assessed at bedside.  Objective:  Physical Exam          Vital Signs: BP (!) 111/57 (BP Location: Left Arm)   Pulse 68   Temp 97.8 F (36.6 C) (Oral)   Resp 18   Wt 81.4 kg   SpO2 96%   BMI 23.04 kg/m  SpO2: SpO2: 96 % O2 Device: O2 Device: Room Air O2 Flow Rate:    Intake/output summary:  Intake/Output Summary (Last 24 hours) at 12/21/2023 1403 Last data filed at 12/21/2023 0531 Gross per 24 hour  Intake --  Output 1150 ml  Net -1150 ml    LBM: Last BM Date : 12/20/23     Palliative Assessment/Data: ***     Palliative Medicine Assessment & Plan   Assessment: Principal Problem:   Ambulatory dysfunction Active Problems:   Protein-calorie malnutrition, severe    Recommendations/Plan: ***  Goals of Care and Additional Recommendations: Limitations on Scope of Treatment: {Recommended Scope and Preferences:21019}  Code Status:   Prognosis:  {Palliative Care Prognosis:23504}  Discharge Planning: {Palliative  dispostion:23505}  Care plan was discussed with ***  Thank you for allowing the Palliative Medicine Team to assist in the care of this patient.   ***   Wynetta Heckle, NP   Please contact Palliative Medicine Team phone at [phone] for questions and concerns.  For individual providers, please see AMION.

## 2023-12-21 NOTE — Hospital Course (Addendum)
 KEONTRE CLOUTIER is a 89 y.o. male with past medical history advanced dementia, hearing loss, vitreous hemorrhage, HTN, hypothyroidism, BPH, coming from home due to a fall.  Patient brought by EMS.  Patient uses a walker and hurt his left shoulder no reports of loss of consciousness.    Multiple falls at home CT lumbar spine shows Irregularity of the anterior cortex of S1 which may reflect mildly displaced fracture. No significant retropulsion Neurosurgery  consulted by EDP, recommended WBAT Therapy evaluations ordered.  - awaiting SNF   Hypernatremia Poor oral intake - patient developing worsening hypernatermia from poor intake and has not been able to maintain adequate nutrition for the past few days - Started on IV fluids on 12/20/2023 with some improvement in intake; also had improvement in Na  - continue oral diet at d/c   Urinary retention Foley catheter placed .  Urine culture are negative.  Continue with flomax  and finasteride  - TOV once stronger with more rehab as likely to fail trial before then; consider trial after ~1 week in rehab   Elevated d dimer Venous duplex of lower extremities is negative for DVT.  Ct angio of the chest is negative for segmental PE.   Hypertension Well controlled.  Orthostatic vital signs negative.  - d/c spironolactone  25 mg daily, cardizem  300 mg daily and d/c hydrochlorothiazide  25 mg daily as BP controlled without - if elevates after discharge can resume back at lower doses likely cardizem  first   Dementia No agitation   Hypothyroidism Resume synthroid    BPH Continue with flomax   Syncope CT angio ruled out PE Echo is unremarkable Telemetry unremarkable  Leukocytosis Resolved.  No pneumonia UA Shows leukocytes. Empirically started on keflex  but discontinued as his cultures are negative   Back pain. Pain control and therapy evaluations    Small nodule on the testicle., as per the family has been there for long time:     In  view of multiple medical issues, advanced age, poor oral intake , deconditioning, palliative care consulted for goals of care.     Pressure Injury  Pressure Injury 12/17/23 Buttocks Right;Left Deep Tissue Pressure Injury - Purple or maroon localized area of discolored intact skin or blood-filled blister due to damage of underlying soft tissue from pressure and/or shear. (Active)  12/17/23 1600  Location: Buttocks  Location Orientation: Right;Left  Staging: Deep Tissue Pressure Injury - Purple or maroon localized area of discolored intact skin or blood-filled blister due to damage of underlying soft tissue from pressure and/or shear.  Wound Description (Comments):   Present on Admission:     Patient fell on his back at home, and had bruising on his back.  Present on admission.   Estimated body mass index is 23.04 kg/m as calculated from the following:   Height as of 06/02/23: 6\' 2"  (1.88 m).   Weight as of this encounter: 81.4 kg.

## 2023-12-21 NOTE — Progress Notes (Signed)
 Nutrition Follow-up  DOCUMENTATION CODES:   Severe malnutrition in context of chronic illness  INTERVENTION:   - Continue feeding assistance with meals  - Continue Ensure Enlive/Ensure Plus High Protein po BID, each supplement provides 350 kcal and 20 grams of protein  - Continue Magic Cup TID with meals, each supplement provides 290 kcal and 9 grams of protein  - Continue MVI with minerals daily  - Continue 1 packet Juven BID to support wound healing, each packet provides 95 calories, 2.5 grams of protein (collagen), and 9.8 grams of carbohydrate  NUTRITION DIAGNOSIS:   Severe Malnutrition related to chronic illness as evidenced by meal completion < 50%, severe muscle depletion, severe fat depletion.  Ongoing, being addressed via oral nutrition supplements and feeding assistance  GOAL:   Patient will meet greater than or equal to 90% of their needs  Progressing  MONITOR:   PO intake, Labs, Supplement acceptance  REASON FOR ASSESSMENT:   Consult Assessment of nutrition requirement/status  ASSESSMENT:   89 y.o Male who presented after a fall from home. No loss of consciousness, does have left shoulder pain. PMH of hearing loss, vitreous hemorrhage, HTN, HLD, BPH, recurrent falls, Alzheimer's diease (per PCP note).  4/30 - dysphagia 1 diet with thin liquids 5/01 - s/p MBS, diet advanced to Regular with thin liquids  Met with pt at bedside. RN in room providing feeding assistance with breakfast meal. Pt currently eating a mix of strawberry yogurt and cottage cheese, both of which are high-protein foods. Discussed oral nutrition supplements with RN who plans to feed pt the Magic Cup next.  RN states that pt had poor PO intake yesterday due to lethargy. Pt received first dose of trazodone this admission the night before which RN suspects may have contributed to lethargy. She states that step-daughter arrived later in the afternoon and was able to get pt to drink an Ensure  and eat about 50% of a meal.  Pt much more awake and alert today and able to communicate with RD. Pt appeared hungry and eager to eat. Will continue with current oral nutrition supplement plan.  Noted Palliative Medicine Team has been following pt regarding GOC.  Pt continues to have edema. Per nursing edema assessment, pt with non-pitting edema to BLE.  Admit weight: 82.4 kg Current weight: 81.4 kg  Meal Completion: 0-75% x last 8 documented meals  Medications reviewed and include: Ensure Enlive BID, levothyroxine , MVI with minerals daily, Juven BID IVF: LR @ 75 mL/hr  Labs reviewed: sodium 147, chloride 112, BUN 64, creatinine 1.25, WBC 12.9  UOP: 1150 mL x 24 hours  Diet Order:   Diet Order             Diet regular Room service appropriate? No; Fluid consistency: Thin  Diet effective now                   EDUCATION NEEDS:   Not appropriate for education at this time  Skin:  Skin Assessment: Skin Integrity Issues: DTI: L buttock Stage III: noted in WOC note, location not specified Unstageable: posterior testicle Other: skin tear to R shoulder  Last BM:  12/20/23 medium type 6  Height:   Ht Readings from Last 1 Encounters:  06/02/23 6\' 2"  (1.88 m)    Weight:   Wt Readings from Last 1 Encounters:  12/21/23 81.4 kg    Ideal Body Weight:  86.4 kg  BMI:  Body mass index is 23.04 kg/m.  Estimated Nutritional Needs:  Kcal:  1800-2000 kcal  Protein:  90-110 gm  Fluid:  >1.8L/day    Ernestina Headland, MS, RD, LDN Registered Dietitian II Please see AMiON for contact information.

## 2023-12-21 NOTE — Progress Notes (Addendum)
 Progress Note    Gilbert Morse   UJW:[id number]  DOB: 12/23/1930  DOA: 12/14/2023     0 PCP: [name], MD  Initial CC: fall at home  Hospital Course: Gilbert Morse is a 89 y.o. male with past medical history advanced dementia, hearing loss, vitreous hemorrhage, HTN, hypothyroidism, BPH, coming from home due to a fall.  Patient brought by EMS.  Patient uses a walker and hurt his left shoulder no reports of loss of consciousness.    Multiple falls at home CT lumbar spine shows Irregularity of the anterior cortex of S1 which may reflect mildly displaced fracture. No significant retropulsion Neurosurgery  consulted by EDP, recommended WBAT Therapy evaluations ordered.  - awaiting SNF   Hypernatremia Poor oral intake - patient developing worsening hypernatermia from poor intake and has not been able to maintain adequate nutrition for the past few days - Started on IV fluids on 12/20/2023 with some improvement in intake but still not adequate; continuing on fluids for now and repeat BMP in a.m.  Urinary retention Foley catheter placed .  Urine culture are negative.  Continue with flomax  and finasteride  - TOV once stronger with more rehab as likely to fail trial before then   Elevated d dimer Venous duplex of lower extremities is negative for DVT.  Ct angio of the chest is negative for segmental PE.   Hypertension Well controlled.  Orthostatic vital signs negative.    Dementia No agitation   Hypothyroidism Resume synthroid    BPH Continue with flomax   Syncope CT angio ruled out PE Echo is unremarkable Telemetry unremarkable  Leukocytosis Resolved.  No pneumonia UA Shows leukocytes. Empirically started on keflex but discontinued as his cultures are negative   Back pain. Pain control and therapy evaluations    Small nodule on the testicle., as per the family has been there for long time:     In view of multiple medical issues, advanced age, poor oral  intake , deconditioning, palliative care consulted for goals of care.     Pressure Injury  Pressure Injury 12/17/23 Buttocks Right;Left Deep Tissue Pressure Injury - Purple or maroon localized area of discolored intact skin or blood-filled blister due to damage of underlying soft tissue from pressure and/or shear. (Active)  12/17/23 1600  Location: Buttocks  Location Orientation: Right;Left  Staging: Deep Tissue Pressure Injury - Purple or maroon localized area of discolored intact skin or blood-filled blister due to damage of underlying soft tissue from pressure and/or shear.  Wound Description (Comments):   Present on Admission:     Patient fell on his back at home, and had bruising on his back.  Present on admission.   Estimated body mass index is 23.04 kg/m as calculated from the following:   Height as of 06/02/23: 6\' 2"  (1.88 m).   Weight as of this encounter: 81.4 kg.  Interval History:  Eating a little better this morning.  Family states he seems a little bit more awake and alert.  He was able to interact more with therapy today.  They are hopeful for getting him placed into rehab.   Old records reviewed in assessment of this patient  Antimicrobials:   DVT prophylaxis:  heparin  injection 5,000 Units Start: 12/14/23 2200   Code Status:   Code Status: Full Code  Mobility Assessment (Last 72 Hours)     Mobility Assessment     Row Name 12/21/23 1300 12/21/23 1100 12/20/23 2200 12/19/23 1924 12/19/23 0800   Does patient  have an order for bedrest or is patient medically unstable -- -- No - Continue assessment No - Continue assessment No - Continue assessment   What is the highest level of mobility based on the progressive mobility assessment? Level 1 (Bedfast) - Unable to balance while sitting on edge of bed Level 1 (Bedfast) - Unable to balance while sitting on edge of bed Level 1 (Bedfast) - Unable to balance while sitting on edge of bed Level 1 (Bedfast) - Unable to  balance while sitting on edge of bed Level 1 (Bedfast) - Unable to balance while sitting on edge of bed   Is the above level different from baseline mobility prior to current illness? -- -- Yes - Recommend PT order Yes - Recommend PT order Yes - Recommend PT order    Row Name 12/18/23 2000           Does patient have an order for bedrest or is patient medically unstable No - Continue assessment       What is the highest level of mobility based on the progressive mobility assessment? Level 1 (Bedfast) - Unable to balance while sitting on edge of bed       Is the above level different from baseline mobility prior to current illness? Yes - Recommend PT order                Barriers to discharge: none Disposition Plan:  SNF Status is: Obs  Objective: Blood pressure (!) 111/57, pulse 68, temperature 97.8 F (36.6 C), temperature source Oral, resp. rate 18, weight 81.4 kg, SpO2 96%.  Examination:  Physical Exam Constitutional:      General: He is not in acute distress.    Appearance: Normal appearance.  HENT:     Head: Normocephalic and atraumatic.     Mouth/Throat:     Mouth: Mucous membranes are moist.  Eyes:     Extraocular Movements: Extraocular movements intact.  Cardiovascular:     Rate and Rhythm: Normal rate and regular rhythm.  Pulmonary:     Effort: Pulmonary effort is normal. No respiratory distress.     Breath sounds: Normal breath sounds. No wheezing.  Abdominal:     General: Bowel sounds are normal. There is no distension.     Palpations: Abdomen is soft.     Tenderness: There is no abdominal tenderness.  Musculoskeletal:     Cervical back: Normal range of motion and neck supple.     Comments: Pain in lower back with straight leg raises  Skin:    General: Skin is warm and dry.  Neurological:     Mental Status: He is alert.     Comments: Baseline dementia appreciated; follows commands  Psychiatric:        Mood and Affect: Mood normal.      Consultants:     Procedures:    Data Reviewed: Results for orders placed or performed during the hospital encounter of 12/14/23 (from the past 24 hours)  CBC with Differential/Platelet     Status: Abnormal   Collection Time: 12/21/23  6:16 AM  Result Value Ref Range   WBC 12.9 (H) 4.0 - 10.5 K/uL   RBC 3.83 (L) 4.22 - 5.81 MIL/uL   Hemoglobin 10.4 (L) 13.0 - 17.0 g/dL   HCT 16.1 (L) 09.6 - 04.5 %   MCV 85.4 80.0 - 100.0 fL   MCH 27.2 26.0 - 34.0 pg   MCHC 31.8 30.0 - 36.0 g/dL   RDW 40.9 (H)  11.5 - 15.5 %   Platelets 303 150 - 400 K/uL   nRBC 0.0 0.0 - 0.2 %   Neutrophils Relative % 72 %   Neutro Abs 9.3 (H) 1.7 - 7.7 K/uL   Lymphocytes Relative 10 %   Lymphs Abs 1.3 0.7 - 4.0 K/uL   Monocytes Relative 11 %   Monocytes Absolute 1.4 (H) 0.1 - 1.0 K/uL   Eosinophils Relative 5 %   Eosinophils Absolute 0.6 (H) 0.0 - 0.5 K/uL   Basophils Relative 1 %   Basophils Absolute 0.1 0.0 - 0.1 K/uL   Immature Granulocytes 1 %   Abs Immature Granulocytes 0.16 (H) 0.00 - 0.07 K/uL  Basic metabolic panel     Status: Abnormal   Collection Time: 12/21/23  6:16 AM  Result Value Ref Range   Sodium 147 (H) 135 - 145 mmol/L   Potassium 4.3 3.5 - 5.1 mmol/L   Chloride 112 (H) 98 - 111 mmol/L   CO2 25 22 - 32 mmol/L   Glucose, Bld 117 (H) 70 - 99 mg/dL   BUN 64 (H) 8 - 23 mg/dL   Creatinine, Ser 1.47 (H) 0.61 - 1.24 mg/dL   Calcium 8.6 (L) 8.9 - 10.3 mg/dL   GFR, Estimated 55 (L) >60 mL/min   Anion gap 10 5 - 15    I have reviewed pertinent nursing notes, vitals, labs, and images as necessary. I have ordered labwork to follow up on as indicated.  I have reviewed the last notes from staff over past 24 hours. I have discussed patient's care plan and test results with nursing staff, CM/SW, and other staff as appropriate.  Time spent: Greater than 50% of the 55 minute visit was spent in counseling/coordination of care for the patient as laid out in the A&P.   LOS: 0 days   [name], MD [facility] 12/21/2023, 5:22 PM

## 2023-12-21 NOTE — Progress Notes (Signed)
 Physical Therapy Treatment Patient Details Name: Gilbert Morse MRN: [id number] DOB: 11-24-1930 Today's Date: 12/21/2023   History of Present Illness 89 y.o. male admitted 12/14/23 after fall at home with elevated Ddimer and no fx. PE and DVT (-). PMhx: hearing loss, HTN, hypothyroidism, BPH, Lt TKA    PT Comments  Pt making slow progress with mobility. Able to tolerate sitting EOB today as well as several partial stands with Stedy. Hope to continue progress toward incr ability to get OOB. Patient will benefit from continued inpatient follow up therapy, <3 hours/day.     If plan is discharge home, recommend the following: Two people to help with walking and/or transfers;Two people to help with bathing/dressing/bathroom;Assist for transportation   Can travel by private vehicle     No  Equipment Recommendations  Hospital bed;Hoyer lift    Recommendations for Other Services       Precautions / Restrictions Precautions Precautions: Fall;Other (comment) Recall of Precautions/Restrictions: Impaired Precaution/Restrictions Comments: incontinent     Mobility  Bed Mobility Overal bed mobility: Needs Assistance Bed Mobility: Rolling, Supine to Sit, Sit to Supine Rolling: Max assist   Supine to sit: +2 for physical assistance, Max assist Sit to supine: +2 for physical assistance, Max assist   General bed mobility comments: Assist to bring legs off of bed, elevate trunk into sitting and bring hips to EOB. Assist to lower trunk and bring legs back up into bed.    Transfers Overall transfer level: Needs assistance Equipment used: Ambulation equipment used Transfers: Sit to/from Stand Sit to Stand: +2 physical assistance, Max assist           General transfer comment: Heavy assist to bring hips up into partial stand with pt using Stedy. Unable to extend hips, knees, trunk fully. Transfer via Lift Equipment: Stedy  Ambulation/Gait                   Stairs              Wheelchair Mobility     Tilt Bed    Modified Rankin (Stroke Patients Only)       Balance Overall balance assessment: Needs assistance Sitting-balance support: Bilateral upper extremity supported, Feet supported Sitting balance-Leahy Scale: Poor Sitting balance - Comments: UE support and min assist Postural control: Other (comment) (Anterior lean at times) Standing balance support: Bilateral upper extremity supported Standing balance-Leahy Scale: Zero Standing balance comment: +2 max for partial stand                            Communication Communication Communication: Impaired Factors Affecting Communication: Hearing impaired  Cognition Arousal: Alert Behavior During Therapy: WFL for tasks assessed/performed   PT - Cognitive impairments: History of cognitive impairments, Orientation, Memory, Problem solving, Safety/Judgement, Sequencing   Orientation impairments: Place, Time, Situation                   PT - Cognition Comments: Slow processing, poor short term memory Following commands: Impaired Following commands impaired: Follows one step commands with increased time    Cueing Cueing Techniques: Verbal cues, Gestural cues, Tactile cues  Exercises      General Comments        Pertinent Vitals/Pain Pain Assessment Pain Assessment: Faces Faces Pain Scale: Hurts even more Pain Location: back and generalized Pain Descriptors / Indicators: Grimacing, Guarding Pain Intervention(s): Limited activity within patient's tolerance, Monitored during session, Repositioned    Home Living  Prior Function            PT Goals (current goals can now be found in the care plan section) Progress towards PT goals: Progressing toward goals    Frequency    Min 1X/week      PT Plan      Co-evaluation PT/OT/SLP Co-Evaluation/Treatment: Yes Reason for Co-Treatment: For patient/therapist safety PT goals  addressed during session: Mobility/safety with mobility;Balance        AM-PAC PT "6 Clicks" Mobility   Outcome Measure  Help needed turning from your back to your side while in a flat bed without using bedrails?: A Lot Help needed moving from lying on your back to sitting on the side of a flat bed without using bedrails?: Total Help needed moving to and from a bed to a chair (including a wheelchair)?: Total Help needed standing up from a chair using your arms (e.g., wheelchair or bedside chair)?: Total Help needed to walk in hospital room?: Total Help needed climbing 3-5 steps with a railing? : Total 6 Click Score: 7    End of Session Equipment Utilized During Treatment: Gait belt Activity Tolerance: Patient tolerated treatment well Patient left: in bed;with call bell/phone within reach;with chair alarm set Nurse Communication: Mobility status;Need for lift equipment PT Visit Diagnosis: Other abnormalities of gait and mobility (R26.89);Difficulty in walking, not elsewhere classified (R26.2);Muscle weakness (generalized) (M62.81);History of falling (Z91.81)     Time: 5784-6962 PT Time Calculation (min) (ACUTE ONLY): 23 min  Charges:    $Therapeutic Activity: 8-22 mins PT General Charges $$ ACUTE PT VISIT: 1 Visit                     [facility] PT Acute Rehabilitation Services Office [phone]    Pura Browns [facility] 12/21/2023, 11:42 AM

## 2023-12-22 DIAGNOSIS — R5381 Other malaise: Secondary | ICD-10-CM | POA: Diagnosis not present

## 2023-12-22 LAB — BASIC METABOLIC PANEL WITH GFR
Anion gap: 7 (ref 5–15)
BUN: 58 mg/dL — ABNORMAL HIGH (ref 8–23)
CO2: 25 mmol/L (ref 22–32)
Calcium: 8.1 mg/dL — ABNORMAL LOW (ref 8.9–10.3)
Chloride: 114 mmol/L — ABNORMAL HIGH (ref 98–111)
Creatinine, Ser: 1.04 mg/dL (ref 0.61–1.24)
GFR, Estimated: >60 mL/min (ref >60)
Glucose, Bld: 101 mg/dL — ABNORMAL HIGH (ref 70–99)
Potassium: 4.2 mmol/L (ref 3.5–5.1)
Sodium: 146 mmol/L — ABNORMAL HIGH (ref 135–145)

## 2023-12-22 LAB — MAGNESIUM: Magnesium: 2.4 mg/dL (ref 1.7–2.4)

## 2023-12-22 NOTE — Plan of Care (Signed)

## 2023-12-22 NOTE — Progress Notes (Signed)
 Progress Note    Gilbert Morse   UJW:[id number]  DOB: 01-31-1931  DOA: 12/14/2023     1 PCP: [name], MD  Initial CC: fall at home  Hospital Course: ARBY HATT is a 89 y.o. male with past medical history advanced dementia, hearing loss, vitreous hemorrhage, HTN, hypothyroidism, BPH, coming from home due to a fall.  Patient brought by EMS.  Patient uses a walker and hurt his left shoulder no reports of loss of consciousness.    Multiple falls at home CT lumbar spine shows Irregularity of the anterior cortex of S1 which may reflect mildly displaced fracture. No significant retropulsion Neurosurgery  consulted by EDP, recommended WBAT Therapy evaluations ordered.  - awaiting SNF   Hypernatremia Poor oral intake - patient developing worsening hypernatermia from poor intake and has not been able to maintain adequate nutrition for the past few days - Started on IV fluids on 12/20/2023 with some improvement in intake but still not adequate; continuing on fluids for now and repeat BMP in a.m.  Urinary retention Foley catheter placed .  Urine culture are negative.  Continue with flomax  and finasteride  - TOV once stronger with more rehab as likely to fail trial before then   Elevated d dimer Venous duplex of lower extremities is negative for DVT.  Ct angio of the chest is negative for segmental PE.   Hypertension Well controlled.  Orthostatic vital signs negative.    Dementia No agitation   Hypothyroidism Resume synthroid    BPH Continue with flomax   Syncope CT angio ruled out PE Echo is unremarkable Telemetry unremarkable  Leukocytosis Resolved.  No pneumonia UA Shows leukocytes. Empirically started on keflex but discontinued as his cultures are negative   Back pain. Pain control and therapy evaluations    Small nodule on the testicle., as per the family has been there for long time:     In view of multiple medical issues, advanced age, poor oral  intake , deconditioning, palliative care consulted for goals of care.     Pressure Injury  Pressure Injury 12/17/23 Buttocks Right;Left Deep Tissue Pressure Injury - Purple or maroon localized area of discolored intact skin or blood-filled blister due to damage of underlying soft tissue from pressure and/or shear. (Active)  12/17/23 1600  Location: Buttocks  Location Orientation: Right;Left  Staging: Deep Tissue Pressure Injury - Purple or maroon localized area of discolored intact skin or blood-filled blister due to damage of underlying soft tissue from pressure and/or shear.  Wound Description (Comments):   Present on Admission:     Patient fell on his back at home, and had bruising on his back.  Present on admission.   Estimated body mass index is 23.04 kg/m as calculated from the following:   Height as of 06/02/23: 6\' 2"  (1.88 m).   Weight as of this encounter: 81.4 kg.  Interval History:  Did not eat much lunch per notes, ~20%. But awake and talking okay, still with noticeable cognitive impairment.    Old records reviewed in assessment of this patient  Antimicrobials:   DVT prophylaxis:  heparin  injection 5,000 Units Start: 12/14/23 2200   Code Status:   Code Status: Full Code  Mobility Assessment (Last 72 Hours)     Mobility Assessment     Row Name 12/22/23 5621 12/21/23 2155 12/21/23 1300 12/21/23 1100 12/20/23 2200   Does patient have an order for bedrest or is patient medically unstable No - Continue assessment No - Continue assessment -- --  No - Continue assessment   What is the highest level of mobility based on the progressive mobility assessment? Level 1 (Bedfast) - Unable to balance while sitting on edge of bed Level 1 (Bedfast) - Unable to balance while sitting on edge of bed Level 1 (Bedfast) - Unable to balance while sitting on edge of bed Level 1 (Bedfast) - Unable to balance while sitting on edge of bed Level 1 (Bedfast) - Unable to balance while sitting on  edge of bed   Is the above level different from baseline mobility prior to current illness? Yes - Recommend PT order Yes - Recommend PT order -- -- Yes - Recommend PT order    Row Name 12/19/23 1924           Does patient have an order for bedrest or is patient medically unstable No - Continue assessment       What is the highest level of mobility based on the progressive mobility assessment? Level 1 (Bedfast) - Unable to balance while sitting on edge of bed       Is the above level different from baseline mobility prior to current illness? Yes - Recommend PT order                Barriers to discharge: none Disposition Plan:  SNF Status is: Obs  Objective: Blood pressure (!) 131/55, pulse 60, temperature 97.8 F (36.6 C), temperature source Oral, resp. rate 18, weight 81.4 kg, SpO2 98%.  Examination:  Physical Exam Constitutional:      General: He is not in acute distress.    Appearance: Normal appearance.  HENT:     Head: Normocephalic and atraumatic.     Mouth/Throat:     Mouth: Mucous membranes are moist.  Eyes:     Extraocular Movements: Extraocular movements intact.  Cardiovascular:     Rate and Rhythm: Normal rate and regular rhythm.  Pulmonary:     Effort: Pulmonary effort is normal. No respiratory distress.     Breath sounds: Normal breath sounds. No wheezing.  Abdominal:     General: Bowel sounds are normal. There is no distension.     Palpations: Abdomen is soft.     Tenderness: There is no abdominal tenderness.  Musculoskeletal:     Cervical back: Normal range of motion and neck supple.     Comments: Pain in lower back with straight leg raises  Skin:    General: Skin is warm and dry.  Neurological:     Mental Status: He is alert.     Comments: Baseline dementia appreciated; follows commands  Psychiatric:        Mood and Affect: Mood normal.      Consultants:    Procedures:    Data Reviewed: Results for orders placed or performed during the  hospital encounter of 12/14/23 (from the past 24 hours)  Basic metabolic panel with GFR     Status: Abnormal   Collection Time: 12/22/23  7:29 AM  Result Value Ref Range   Sodium 146 (H) 135 - 145 mmol/L   Potassium 4.2 3.5 - 5.1 mmol/L   Chloride 114 (H) 98 - 111 mmol/L   CO2 25 22 - 32 mmol/L   Glucose, Bld 101 (H) 70 - 99 mg/dL   BUN 58 (H) 8 - 23 mg/dL   Creatinine, Ser 1.61 0.61 - 1.24 mg/dL   Calcium 8.1 (L) 8.9 - 10.3 mg/dL   GFR, Estimated >09 >60 mL/min   Anion gap 7  5 - 15  Magnesium     Status: None   Collection Time: 12/22/23  7:29 AM  Result Value Ref Range   Magnesium 2.4 1.7 - 2.4 mg/dL    I have reviewed pertinent nursing notes, vitals, labs, and images as necessary. I have ordered labwork to follow up on as indicated.  I have reviewed the last notes from staff over past 24 hours. I have discussed patient's care plan and test results with nursing staff, CM/SW, and other staff as appropriate.    LOS: 1 day   [name], MD [facility] 12/22/2023, 3:39 PM

## 2023-12-22 NOTE — Plan of Care (Signed)
 Patient alert to self. Foley in place draining clear straw colored urine to gravity. All dressings changed and skin tears documented. Better appetite today. LR running today at 64ml/hr via RPIV, now discontinued. Safety precautions maintained.    Problem: Clinical Measurements: Goal: Ability to maintain clinical measurements within normal limits will improve Outcome: Progressing   Problem: Activity: Goal: Risk for activity intolerance will decrease Outcome: Progressing   Problem: Nutrition: Goal: Adequate nutrition will be maintained Outcome: Progressing

## 2023-12-22 NOTE — TOC Progression Note (Addendum)
 Transition of Care [facility]) - Progression Note    Patient Details  Name: Gilbert Morse MRN: [id number] Date of Birth: 22-Jan-1931  [facility] CM/SW Contact  Rosamary Boudreau A [place], LCSW Phone Number: 12/22/2023, 10:18 AM  Clinical Narrative:     Update 1601 Pt's insurance authorization started. Status pending.  [name] ID: [id number] [facility] notified, possible bed tomorrow if pt is stable for DC and authorization is approved.   1018 CSW made attempt to start pt's insurance authorization for [facility] and Rehab. Authorization was unable to be completed on [facility] Community [facility] Portal. CSW to call [facility] and attempt authorization at another more opportune time.     TOC will continue to follow.   Expected Discharge Plan: Skilled Nursing Facility Barriers to Discharge: Continued Medical Work up, SNF Pending bed offer, Insurance Authorization  Expected Discharge Plan and Services       Living arrangements for the past 2 months: Single Family Home                                       Social Determinants of Health (SDOH) Interventions SDOH Screenings   Food Insecurity: Patient Unable To Answer (12/18/2023)  Housing: Patient Unable To Answer (12/18/2023)  Transportation Needs: Patient Unable To Answer (12/18/2023)  Utilities: Patient Unable To Answer (12/18/2023)  Social Connections: Unknown (12/18/2023)  Tobacco Use: Low Risk  (12/06/2023)   Received from [facility]    Readmission Risk Interventions     No data to display

## 2023-12-22 NOTE — Progress Notes (Signed)
 Progress Note    Gilbert Morse   ZOX:[id number]  DOB: 1931-05-25  DOA: 12/14/2023     1 PCP: [name], MD  Initial CC: fall at home  Hospital Course: Gilbert Morse is a 89 y.o. male with past medical history advanced dementia, hearing loss, vitreous hemorrhage, HTN, hypothyroidism, BPH, coming from home due to a fall.  Patient brought by EMS.  Patient uses a walker and hurt his left shoulder no reports of loss of consciousness.    Multiple falls at home CT lumbar spine shows Irregularity of the anterior cortex of S1 which may reflect mildly displaced fracture. No significant retropulsion Neurosurgery  consulted by EDP, recommended WBAT Therapy evaluations ordered.  - awaiting SNF   Hypernatremia Poor oral intake - patient developing worsening hypernatermia from poor intake and has not been able to maintain adequate nutrition for the past few days - Started on IV fluids on 12/20/2023 with some improvement in intake but still not adequate; continuing on fluids for now and repeat BMP in a.m.  Urinary retention Foley catheter placed .  Urine culture are negative.  Continue with flomax  and finasteride  - TOV once stronger with more rehab as likely to fail trial before then   Elevated d dimer Venous duplex of lower extremities is negative for DVT.  Ct angio of the chest is negative for segmental PE.   Hypertension Well controlled.  Orthostatic vital signs negative.    Dementia No agitation   Hypothyroidism Resume synthroid    BPH Continue with flomax   Syncope CT angio ruled out PE Echo is unremarkable Telemetry unremarkable  Leukocytosis Resolved.  No pneumonia UA Shows leukocytes. Empirically started on keflex but discontinued as his cultures are negative   Back pain. Pain control and therapy evaluations    Small nodule on the testicle., as per the family has been there for long time:     In view of multiple medical issues, advanced age, poor oral  intake , deconditioning, palliative care consulted for goals of care.     Pressure Injury  Pressure Injury 12/17/23 Buttocks Right;Left Deep Tissue Pressure Injury - Purple or maroon localized area of discolored intact skin or blood-filled blister due to damage of underlying soft tissue from pressure and/or shear. (Active)  12/17/23 1600  Location: Buttocks  Location Orientation: Right;Left  Staging: Deep Tissue Pressure Injury - Purple or maroon localized area of discolored intact skin or blood-filled blister due to damage of underlying soft tissue from pressure and/or shear.  Wound Description (Comments):   Present on Admission:     Patient fell on his back at home, and had bruising on his back.  Present on admission.   Estimated body mass index is 23.04 kg/m as calculated from the following:   Height as of 06/02/23: 6\' 2"  (1.88 m).   Weight as of this encounter: 81.4 kg.  Interval History:  Did not eat much lunch per notes, ~20%. But awake and talking okay, still with noticeable cognitive impairment.  Updated Devra Fontana on the phone this afternoon.  Old records reviewed in assessment of this patient  Antimicrobials:   DVT prophylaxis:  heparin  injection 5,000 Units Start: 12/14/23 2200   Code Status:   Code Status: Full Code  Mobility Assessment (Last 72 Hours)     Mobility Assessment     Row Name 12/22/23 8119 12/21/23 2155 12/21/23 1300 12/21/23 1100 12/20/23 2200   Does patient have an order for bedrest or is patient medically unstable No - Continue  assessment No - Continue assessment -- -- No - Continue assessment   What is the highest level of mobility based on the progressive mobility assessment? Level 1 (Bedfast) - Unable to balance while sitting on edge of bed Level 1 (Bedfast) - Unable to balance while sitting on edge of bed Level 1 (Bedfast) - Unable to balance while sitting on edge of bed Level 1 (Bedfast) - Unable to balance while sitting on edge of bed Level 1  (Bedfast) - Unable to balance while sitting on edge of bed   Is the above level different from baseline mobility prior to current illness? Yes - Recommend PT order Yes - Recommend PT order -- -- Yes - Recommend PT order    Row Name 12/19/23 1924           Does patient have an order for bedrest or is patient medically unstable No - Continue assessment       What is the highest level of mobility based on the progressive mobility assessment? Level 1 (Bedfast) - Unable to balance while sitting on edge of bed       Is the above level different from baseline mobility prior to current illness? Yes - Recommend PT order                Barriers to discharge: none Disposition Plan:  SNF Status is: Obs  Objective: Blood pressure (!) 131/55, pulse 60, temperature 97.8 F (36.6 C), temperature source Oral, resp. rate 18, weight 81.4 kg, SpO2 98%.  Examination:  Physical Exam Constitutional:      General: He is not in acute distress.    Appearance: Normal appearance.  HENT:     Head: Normocephalic and atraumatic.     Mouth/Throat:     Mouth: Mucous membranes are moist.  Eyes:     Extraocular Movements: Extraocular movements intact.  Cardiovascular:     Rate and Rhythm: Normal rate and regular rhythm.  Pulmonary:     Effort: Pulmonary effort is normal. No respiratory distress.     Breath sounds: Normal breath sounds. No wheezing.  Abdominal:     General: Bowel sounds are normal. There is no distension.     Palpations: Abdomen is soft.     Tenderness: There is no abdominal tenderness.  Musculoskeletal:     Cervical back: Normal range of motion and neck supple.     Comments: Pain in lower back with straight leg raises  Skin:    General: Skin is warm and dry.  Neurological:     Mental Status: He is alert.     Comments: Baseline dementia appreciated; follows commands  Psychiatric:        Mood and Affect: Mood normal.      Consultants:    Procedures:    Data  Reviewed: Results for orders placed or performed during the hospital encounter of 12/14/23 (from the past 24 hours)  Basic metabolic panel with GFR     Status: Abnormal   Collection Time: 12/22/23  7:29 AM  Result Value Ref Range   Sodium 146 (H) 135 - 145 mmol/L   Potassium 4.2 3.5 - 5.1 mmol/L   Chloride 114 (H) 98 - 111 mmol/L   CO2 25 22 - 32 mmol/L   Glucose, Bld 101 (H) 70 - 99 mg/dL   BUN 58 (H) 8 - 23 mg/dL   Creatinine, Ser 4.09 0.61 - 1.24 mg/dL   Calcium 8.1 (L) 8.9 - 10.3 mg/dL   GFR, Estimated >81 >  60 mL/min   Anion gap 7 5 - 15  Magnesium     Status: None   Collection Time: 12/22/23  7:29 AM  Result Value Ref Range   Magnesium 2.4 1.7 - 2.4 mg/dL    I have reviewed pertinent nursing notes, vitals, labs, and images as necessary. I have ordered labwork to follow up on as indicated.  I have reviewed the last notes from staff over past 24 hours. I have discussed patient's care plan and test results with nursing staff, CM/SW, and other staff as appropriate.    LOS: 1 day   [name], MD [facility] 12/22/2023, 3:45 PM

## 2023-12-23 DIAGNOSIS — R5381 Other malaise: Secondary | ICD-10-CM | POA: Diagnosis not present

## 2023-12-23 DIAGNOSIS — F0390 Unspecified dementia without behavioral disturbance: Secondary | ICD-10-CM | POA: Diagnosis not present

## 2023-12-23 DIAGNOSIS — R338 Other retention of urine: Secondary | ICD-10-CM | POA: Diagnosis not present

## 2023-12-23 LAB — BASIC METABOLIC PANEL WITH GFR
Anion gap: 6 (ref 5–15)
BUN: 40 mg/dL — ABNORMAL HIGH (ref 8–23)
CO2: 26 mmol/L (ref 22–32)
Calcium: 8.1 mg/dL — ABNORMAL LOW (ref 8.9–10.3)
Chloride: 108 mmol/L (ref 98–111)
Creatinine, Ser: 0.86 mg/dL (ref 0.61–1.24)
GFR, Estimated: >60 mL/min (ref >60)
Glucose, Bld: 95 mg/dL (ref 70–99)
Potassium: 3.9 mmol/L (ref 3.5–5.1)
Sodium: 140 mmol/L (ref 135–145)

## 2023-12-23 LAB — MAGNESIUM: Magnesium: 2.1 mg/dL (ref 1.7–2.4)

## 2023-12-23 MED ORDER — JUVEN PO PACK: 1.0000 | PACK | Freq: Two times a day (BID) | ORAL | Status: AC

## 2023-12-23 MED ORDER — ADULT MULTIVITAMIN W/MINERALS CH: 1.0000 | ORAL_TABLET | Freq: Every day | ORAL | Status: AC

## 2023-12-23 NOTE — TOC Progression Note (Signed)
 Transition of Care [facility]) - Progression Note    Patient Details  Name: Gilbert Morse MRN: [id number] Date of Birth: 04/08/1931  Transition of [facility]) CM/SW Contact  Savian Mazon A [place], LCSW Phone Number: 12/23/2023, 10:09 AM  Clinical Narrative:     [name] remains pending for [name].   Auth ID: : [id number]    TOC will continue to follow.   Expected Discharge Plan: Skilled Nursing Facility Barriers to Discharge: Continued Medical Work up, SNF Pending bed offer, Insurance Authorization  Expected Discharge Plan and Services       Living arrangements for the past 2 months: Single Family Home                                       Social Determinants of Health (SDOH) Interventions SDOH Screenings   Food Insecurity: Patient Unable To Answer (12/18/2023)  Housing: Patient Unable To Answer (12/18/2023)  Transportation Needs: Patient Unable To Answer (12/18/2023)  Utilities: Patient Unable To Answer (12/18/2023)  Social Connections: Unknown (12/18/2023)  Tobacco Use: Low Risk  (12/06/2023)   Received from [facility]    Readmission Risk Interventions     No data to display

## 2023-12-23 NOTE — TOC Transition Note (Signed)
 Transition of Care [facility]) - Discharge Note   Patient Details  Name: Gilbert Morse MRN: [id number] Date of Birth: 1930-12-23  Transition of [facility]) CM/SW Contact:  Samatha Anspach A [place], LCSW Phone Number: 12/23/2023, 11:49 AM   Clinical Narrative:     Patient will DC to: [facility]  Anticipated DC date: 12/23/23  Family notified: Tor Freed  Transport by: Lyna Sandhoff     Authorization approved.   Auth ID: [id number] Reference ID: [id number]  Approval Dates:  12/22/2023-12/26/2023  Per MD patient ready for DC to [facility]. RN, patient, patient's family, and facility notified of DC. Discharge Summary and FL2 sent to facility. RN to call report prior to discharge ()Room 106, [phone]). DC packet on chart. Ambulance transport requested for patient.     CSW will sign off for now as social work intervention is no longer needed. Please consult us  again if new needs arise.    Final next level of care: Skilled Nursing Facility Barriers to Discharge: Barriers Resolved   Patient Goals and CMS Choice            Discharge Placement              Patient chooses bed at: [facility] Patient to be transferred to facility by: [facility] Name of family member notified: Tor Freed Patient and family notified of of transfer: 12/23/23  Discharge Plan and Services Additional resources added to the After Visit Summary for                                       Social Drivers of Health (SDOH) Interventions SDOH Screenings   Food Insecurity: Patient Unable To Answer (12/18/2023)  Housing: Patient Unable To Answer (12/18/2023)  Transportation Needs: Patient Unable To Answer (12/18/2023)  Utilities: Patient Unable To Answer (12/18/2023)  Social Connections: Unknown (12/18/2023)  Tobacco Use: Low Risk  (12/06/2023)   Received from [facility]     Readmission Risk Interventions     No data to display

## 2023-12-23 NOTE — Plan of Care (Signed)

## 2023-12-23 NOTE — Progress Notes (Signed)
 Palliative Medicine Progress Note   Patient Name: Gilbert Morse       Date: 12/23/2023 DOB: [birth year]  Age: 89 y.o. MRN#: [id number] Attending Physician: [name], MD Primary Care Physician: [name], MD Admit Date: 12/14/2023   HPI/Patient Profile: 89 y.o. male  with past medical history of dementia, hearing loss, vitreous hemorrhage, hypertension, hypothyroidism, and BPH who presented to the ED on 12/14/2023 after a fall at home.  CT lumbar spine showed irregularity of the anterior cortex of S1 which may reflect mildly displaced fracture.   Palliative Medicine has been consulted for goals of care  Subjective: Chart reviewed. Update received from RN. Patient assessed at bedside.   I spoke with wife/Gilbert Morse and daughter/Gilbert Morse by phone. We discussed concepts specific to code status/CPR, intubation, antibiotics, IV fluids, artifical feeding, and rehospitalization. The MOST form was reviewed in detail. The family outlined their wishes for the following treatment decisions:  Cardiopulmonary Resuscitation: Do Not Attempt Resuscitation (DNR/No CPR)  Medical Interventions: Limited Additional Interventions: Use medical treatment, IV fluids and cardiac monitoring as indicated, DO NOT USE intubation or mechanical ventilation. May consider use of less invasive airway support such as BiPAP or CPAP. Also provide comfort measures. Transfer to the hospital if indicated. Avoid intensive care.   Antibiotics: Determine use of limitation of antibiotics when infection occurs  IV Fluids: IV fluids for a defined trial period  Feeding Tube: No feeding tube     Objective:  Physical Exam Vitals reviewed.  Constitutional:      General: He is awake. He is not in acute distress.    Comments: Frail and  chronically ill-appearing  Pulmonary:     Effort: Pulmonary effort is normal.  Neurological:     Comments: Oriented to self and hospital  Psychiatric:        Cognition and Memory: Cognition is impaired. Memory is impaired.           Palliative Medicine Assessment & Plan   Assessment: Principal Problem:   Physical deconditioning Active Problems:   Protein-calorie malnutrition, severe   Fall at home, initial encounter   HTN (hypertension)   Dementia without behavioral disturbance (HCC)   BPH (benign prostatic hyperplasia)   Acute urinary retention   Back pain    Recommendations/Plan: Code status changed to DNR - Limited Continue current supportive  interventions Goal is for rehab to improve functional status Continue outpatient palliative at discharge PMT will continue to follow    Symptom Management:  Lidocaine  5% patch to lower back Trazodone  50mg  Q6H PRN sleep and agitation/anxiety  Prognosis:  Unable to determine  Discharge Planning: Skilled Nursing Facility for rehab with Palliative care service follow-up   Thank you for allowing the Palliative Medicine Team to assist in the care of this patient.   Time: 50 minutes   Wynetta Heckle, NP   Please contact Palliative Medicine Team phone at [phone] for questions and concerns.  For individual providers, please see AMION.

## 2023-12-23 NOTE — Discharge Summary (Signed)
 Physician Discharge Summary   Gilbert GULAN MWU:[id number] DOB: 03-27-1931 DOA: 12/14/2023  PCP: [name], Gilbert  Admit date: 12/14/2023 Discharge date: 12/23/2023  Admitted From: Home Disposition:  [facility]  Discharging physician: [name], Gilbert Barriers to discharge: none  Recommendations at discharge: Continue wound care Trial of void after ~1 week in rehab If BP starts to elevate again, resume cardizem  (see below)   Discharge Condition: stable CODE STATUS: DNR Diet recommendation:  Diet Orders (From admission, onward)     Start     Ordered   12/23/23 0000  Diet general        12/23/23 1116   12/16/23 1130  Diet regular Room service appropriate? No; Fluid consistency: Thin  Diet effective now       Question Answer Comment  Room service appropriate? No   Fluid consistency: Thin      12/16/23 1131            Hospital Course: KERY CORTS is a 89 y.o. male with past medical history advanced dementia, hearing loss, vitreous hemorrhage, HTN, hypothyroidism, BPH, coming from home due to a fall.  Patient brought by EMS.  Patient uses a walker and hurt his left shoulder no reports of loss of consciousness.    Multiple falls at home CT lumbar spine shows Irregularity of the anterior cortex of S1 which may reflect mildly displaced fracture. No significant retropulsion Neurosurgery  consulted by EDP, recommended WBAT Therapy evaluations ordered.  - awaiting SNF   Hypernatremia Poor oral intake - patient developing worsening hypernatermia from poor intake and has not been able to maintain adequate nutrition for the past few days - Started on IV fluids on 12/20/2023 with some improvement in intake; also had improvement in Na  - continue oral diet at d/c   Urinary retention Foley catheter placed .  Urine culture are negative.  Continue with flomax  and finasteride  - TOV once stronger with more rehab as likely to fail trial before then; consider trial after ~1  week in rehab   Elevated d dimer Venous duplex of lower extremities is negative for DVT.  Ct angio of the chest is negative for segmental PE.   Hypertension Well controlled.  Orthostatic vital signs negative.  - d/c spironolactone  25 mg daily, cardizem  300 mg daily and d/c hydrochlorothiazide  25 mg daily as BP controlled without - if elevates after discharge can resume back at lower doses likely cardizem  first   Dementia No agitation   Hypothyroidism Resume synthroid    BPH Continue with flomax   Syncope CT angio ruled out PE Echo is unremarkable Telemetry unremarkable  Leukocytosis Resolved.  No pneumonia UA Shows leukocytes. Empirically started on keflex  but discontinued as his cultures are negative   Back pain. Pain control and therapy evaluations    Small nodule on the testicle., as per the family has been there for long time:     In view of multiple medical issues, advanced age, poor oral intake , deconditioning, palliative care consulted for goals of care.     Pressure Injury  Pressure Injury 12/17/23 Buttocks Right;Left Deep Tissue Pressure Injury - Purple or maroon localized area of discolored intact skin or blood-filled blister due to damage of underlying soft tissue from pressure and/or shear. (Active)  12/17/23 1600  Location: Buttocks  Location Orientation: Right;Left  Staging: Deep Tissue Pressure Injury - Purple or maroon localized area of discolored intact skin or blood-filled blister due to damage of underlying soft tissue from pressure and/or  shear.  Wound Description (Comments):   Present on Admission:     Patient fell on his back at home, and had bruising on his back.  Present on admission.   Estimated body mass index is 23.04 kg/m as calculated from the following:   Height as of 06/02/23: 6\' 2"  (1.88 m).   Weight as of this encounter: 81.4 kg.    Principal Diagnosis: Physical deconditioning  Discharge Diagnoses: Active Hospital Problems    Diagnosis Date Noted   Physical deconditioning 12/14/2023    Priority: 1.   Back pain 12/21/2023    Priority: 2.   Acute urinary retention 12/21/2023    Priority: 3.   Fall at home, initial encounter 12/21/2023   HTN (hypertension) 12/21/2023   Dementia without behavioral disturbance (HCC) 12/21/2023   BPH (benign prostatic hyperplasia) 12/21/2023   Protein-calorie malnutrition, severe 12/17/2023    Resolved Hospital Problems  No resolved problems to display.     Discharge Instructions     Diet general   Complete by: As directed    Discharge wound care:   Complete by: As directed    Single layer of xeroform to the right shoulder, left buttock, right buttock wounds, top with foam. Change every other day   Increase activity slowly   Complete by: As directed       Allergies as of 12/23/2023   No Known Allergies      Medication List     STOP taking these medications    diltiazem  300 MG 24 hr capsule Commonly known as: CARDIZEM  CD   hydrochlorothiazide  25 MG tablet Commonly known as: HYDRODIURIL    spironolactone  25 MG tablet Commonly known as: ALDACTONE        TAKE these medications    cyanocobalamin 100 MCG tablet Take 100 mcg by mouth daily.   finasteride  5 MG tablet Commonly known as: PROSCAR  Take 5 mg by mouth at bedtime.   levothyroxine  25 MCG tablet Commonly known as: SYNTHROID  Take 25 mcg by mouth daily before breakfast.   multivitamin with minerals Tabs tablet Take 1 tablet by mouth daily. Start taking on: Dec 24, 2023   nutrition supplement (JUVEN) Pack Take 1 packet by mouth 2 (two) times daily between meals.   tamsulosin  0.4 MG Caps capsule Commonly known as: FLOMAX  Take 0.4 mg by mouth at bedtime.               Discharge Care Instructions  (From admission, onward)           Start     Ordered   12/23/23 0000  Discharge wound care:       Comments: Single layer of xeroform to the right shoulder, left buttock, right  buttock wounds, top with foam. Change every other day   12/23/23 1116            Contact information for after-discharge care     Destination     [facility] Preferred SNF .   Service: Skilled Nursing Contact information: [address] [place]  [address] [phone]                    No Known Allergies  Consultations:   Procedures:   Discharge Exam: BP 131/63 (BP Location: Left Arm)   Pulse (!) 59   Temp 98.1 F (36.7 C) (Axillary)   Resp 18   Wt 83.5 kg   SpO2 99%   BMI 23.64 kg/m  Physical Exam Constitutional:  General: He is not in acute distress.    Appearance: Normal appearance.  HENT:     Head: Normocephalic and atraumatic.     Mouth/Throat:     Mouth: Mucous membranes are moist.  Eyes:     Extraocular Movements: Extraocular movements intact.  Cardiovascular:     Rate and Rhythm: Normal rate and regular rhythm.  Pulmonary:     Effort: Pulmonary effort is normal. No respiratory distress.     Breath sounds: Normal breath sounds. No wheezing.  Abdominal:     General: Bowel sounds are normal. There is no distension.     Palpations: Abdomen is soft.     Tenderness: There is no abdominal tenderness.  Musculoskeletal:     Cervical back: Normal range of motion and neck supple.     Comments: Pain in lower back with straight leg raises  Skin:    General: Skin is warm and dry.  Neurological:     Mental Status: He is alert.     Comments: Baseline dementia appreciated; follows commands  Psychiatric:        Mood and Affect: Mood normal.      The results of significant diagnostics from this hospitalization (including imaging, microbiology, ancillary and laboratory) are listed below for reference.   Microbiology: Recent Results (from the past 240 hours)  Urine Culture (for pregnant, neutropenic or urologic patients or patients with an indwelling urinary catheter)     Status: None   Collection Time: 12/18/23   4:16 PM   Specimen: Urine, Catheterized  Result Value Ref Range Status   Specimen Description URINE, CATHETERIZED  Final   Special Requests NONE  Final   Culture   Final    NO GROWTH Performed at [facility], [address], [place], [place] [address]    Report Status 12/19/2023 FINAL  Final     Labs: BNP (last 3 results) No results for input(s): "BNP" in the last 8760 hours. Basic Metabolic Panel: Recent Labs  Lab 12/17/23 0653 12/18/23 0608 12/19/23 0454 12/21/23 0616 12/22/23 0729 12/23/23 0723  NA  --  140  --  147* 146* 140  K 3.6 3.7  --  4.3 4.2 3.9  CL  --  106  --  112* 114* 108  CO2  --  26  --  25 25 26   GLUCOSE  --  117*  --  117* 101* 95  BUN  --  51*  --  64* 58* 40*  CREATININE  --  1.21  --  1.25* 1.04 0.86  CALCIUM  --  8.6*  --  8.6* 8.1* 8.1*  MG 2.3 2.3 2.5*  --  2.4 2.1  PHOS 3.2 3.1 3.1  --   --   --    Liver Function Tests: No results for input(s): "AST", "ALT", "ALKPHOS", "BILITOT", "PROT", "ALBUMIN" in the last 168 hours. No results for input(s): "LIPASE", "AMYLASE" in the last 168 hours. No results for input(s): "AMMONIA" in the last 168 hours. CBC: Recent Labs  Lab 12/17/23 0653 12/18/23 0608 12/21/23 0616  WBC 10.3 11.2* 12.9*  NEUTROABS 7.4 8.4* 9.3*  HGB 9.8* 11.2* 10.4*  HCT 30.9* 35.1* 32.7*  MCV 85.6 85.0 85.4  PLT 182 210 303   Cardiac Enzymes: No results for input(s): "CKTOTAL", "CKMB", "CKMBINDEX", "TROPONINI" in the last 168 hours. BNP: Invalid input(s): "POCBNP" CBG: No results for input(s): "GLUCAP" in the last 168 hours. D-Dimer No results for input(s): "DDIMER" in the last 72 hours. Hgb  A1c No results for input(s): "HGBA1C" in the last 72 hours. Lipid Profile No results for input(s): "CHOL", "HDL", "LDLCALC", "TRIG", "CHOLHDL", "LDLDIRECT" in the last 72 hours. Thyroid function studies No results for input(s): "TSH", "T4TOTAL", "T3FREE", "THYROIDAB" in the last 72 hours.  Invalid input(s):  "FREET3" Anemia work up No results for input(s): "VITAMINB12", "FOLATE", "FERRITIN", "TIBC", "IRON", "RETICCTPCT" in the last 72 hours. Urinalysis    Component Value Date/Time   COLORURINE YELLOW 12/17/2023 1816   APPEARANCEUR CLEAR 12/17/2023 1816   LABSPEC 1.016 12/17/2023 1816   PHURINE 6.0 12/17/2023 1816   GLUCOSEU NEGATIVE 12/17/2023 1816   HGBUR LARGE (A) 12/17/2023 1816   BILIRUBINUR NEGATIVE 12/17/2023 1816   KETONESUR NEGATIVE 12/17/2023 1816   PROTEINUR 30 (A) 12/17/2023 1816   UROBILINOGEN 0.2 06/04/2009 1409   NITRITE NEGATIVE 12/17/2023 1816   LEUKOCYTESUR TRACE (A) 12/17/2023 1816   Sepsis Labs Recent Labs  Lab 12/17/23 0653 12/18/23 0608 12/21/23 0616  WBC 10.3 11.2* 12.9*   Microbiology Recent Results (from the past 240 hours)  Urine Culture (for pregnant, neutropenic or urologic patients or patients with an indwelling urinary catheter)     Status: None   Collection Time: 12/18/23  4:16 PM   Specimen: Urine, Catheterized  Result Value Ref Range Status   Specimen Description URINE, CATHETERIZED  Final   Special Requests NONE  Final   Culture   Final    NO GROWTH Performed at [facility], [address] [place], [place] [address]    Report Status 12/19/2023 FINAL  Final    Procedures/Studies: VAS US  LOWER EXTREMITY VENOUS (DVT) Result Date: 12/15/2023  Lower Venous DVT Study Patient Name:  ZORAN SCHEID  Date of Exam:   12/15/2023 Medical Rec #: [id number]         Accession #:    [id number] Date of Birth: 05/19/1931          Patient Gender: M Patient Age:   49 years Exam Location:  [facility] Procedure:      VAS US  LOWER EXTREMITY VENOUS (DVT) Referring Phys: [name] --------------------------------------------------------------------------------  Indications: Swelling, Pain, Edema, and Patient had a recent fall.  Comparison Study: 05-17-19 - Negative Performing Technologist: Franky Ivanoff Sturdivant-Jones RDMS, RVT  Examination Guidelines: A  complete evaluation includes B-mode imaging, spectral Doppler, color Doppler, and power Doppler as needed of all accessible portions of each vessel. Bilateral testing is considered an integral part of a complete examination. Limited examinations for reoccurring indications may be performed as noted. The reflux portion of the exam is performed with the patient in reverse Trendelenburg.  +---------+---------------+---------+-----------+----------+--------------+ RIGHT    CompressibilityPhasicitySpontaneityPropertiesThrombus Aging +---------+---------------+---------+-----------+----------+--------------+ CFV      Full           Yes      Yes                                 +---------+---------------+---------+-----------+----------+--------------+ SFJ      Full                                                        +---------+---------------+---------+-----------+----------+--------------+ FV Prox  Full                                                        +---------+---------------+---------+-----------+----------+--------------+  FV Mid   Full                                                        +---------+---------------+---------+-----------+----------+--------------+ FV DistalFull                                                        +---------+---------------+---------+-----------+----------+--------------+ PFV      Full                                                        +---------+---------------+---------+-----------+----------+--------------+ POP      Full           Yes      Yes                                 +---------+---------------+---------+-----------+----------+--------------+ PTV      Full                                                        +---------+---------------+---------+-----------+----------+--------------+ PERO     Full                                                         +---------+---------------+---------+-----------+----------+--------------+   +---------+---------------+---------+-----------+----------+--------------+ LEFT     CompressibilityPhasicitySpontaneityPropertiesThrombus Aging +---------+---------------+---------+-----------+----------+--------------+ CFV      Full           Yes      Yes                                 +---------+---------------+---------+-----------+----------+--------------+ SFJ      Full                                                        +---------+---------------+---------+-----------+----------+--------------+ FV Prox  Full                                                        +---------+---------------+---------+-----------+----------+--------------+ FV Mid   Full                                                        +---------+---------------+---------+-----------+----------+--------------+  FV DistalFull                                                        +---------+---------------+---------+-----------+----------+--------------+ PFV      Full                                                        +---------+---------------+---------+-----------+----------+--------------+ POP      Full           Yes                                          +---------+---------------+---------+-----------+----------+--------------+ PTV      Full                                                        +---------+---------------+---------+-----------+----------+--------------+ PERO     Full                                                        +---------+---------------+---------+-----------+----------+--------------+     Summary: BILATERAL: - No evidence of deep vein thrombosis seen in the lower extremities, bilaterally. -No evidence of popliteal cyst, bilaterally.   *See table(s) above for measurements and observations. Electronically signed by Runell Countryman on 12/15/2023 at 5:50:12 PM.     Final    ECHOCARDIOGRAM COMPLETE Result Date: 12/15/2023    ECHOCARDIOGRAM REPORT   Patient Name:   UZZIEL RIEDINGER Date of Exam: 12/15/2023 Medical Rec #:  [id number]        Height:       74.0 in Accession #:    [id number]       Weight:       181.7 lb Date of Birth:  06-14-1931         BSA:          2.086 m Patient Age:    89 years         BP:           128/60 mmHg Patient Gender: M                HR:           93 bpm. Exam Location:  Inpatient Procedure: 2D Echo, Cardiac Doppler and Color Doppler (Both Spectral and Color            Flow Doppler were utilized during procedure). Indications:    Syncope R55  History:        Patient has no prior history of Echocardiogram examinations.  Sonographer:    Terrilee Few RCS Referring Phys: Myron Asters  Sonographer Comments: Image acquisition challenging due to patient behavioral factors. and Image acquisition challenging due to uncooperative patient. IMPRESSIONS  1. There is  significant septal dyssynchony related to bundle branch block. Left ventricular ejection fraction, by estimation, is 60 to 65%. The left ventricle has normal function. The left ventricle has no regional wall motion abnormalities. There is mild concentric left ventricular hypertrophy. Left ventricular diastolic parameters are indeterminate.  2. Right ventricular systolic function is normal. The right ventricular size is normal.  3. The mitral valve is normal in structure. Trivial mitral valve regurgitation. No evidence of mitral stenosis.  4. The aortic valve is normal in structure. There is mild calcification of the aortic valve. Aortic valve regurgitation is not visualized. No aortic stenosis is present.  5. Aortic dilatation noted. There is borderline dilatation of the aortic root, measuring 38 mm.  6. The inferior vena cava is normal in size with greater than 50% respiratory variability, suggesting right atrial pressure of 3 mmHg. FINDINGS  Left Ventricle: There is significant septal  dyssynchony related to bundle branch block. Left ventricular ejection fraction, by estimation, is 60 to 65%. The left ventricle has normal function. The left ventricle has no regional wall motion abnormalities. The left ventricular internal cavity size was normal in size. There is mild concentric left ventricular hypertrophy. Abnormal (paradoxical) septal motion, consistent with left bundle branch block. Left ventricular diastolic parameters are indeterminate. Right Ventricle: The right ventricular size is normal. No increase in right ventricular wall thickness. Right ventricular systolic function is normal. Left Atrium: Left atrial size was normal in size. Right Atrium: Right atrial size was normal in size. Pericardium: Trivial pericardial effusion is present. Mitral Valve: The mitral valve is normal in structure. Trivial mitral valve regurgitation. No evidence of mitral valve stenosis. Tricuspid Valve: The tricuspid valve is normal in structure. Tricuspid valve regurgitation is trivial. No evidence of tricuspid stenosis. Aortic Valve: The aortic valve is normal in structure. There is mild calcification of the aortic valve. Aortic valve regurgitation is not visualized. No aortic stenosis is present. Aortic valve peak gradient measures 5.8 mmHg. Pulmonic Valve: The pulmonic valve was not well visualized. Pulmonic valve regurgitation is not visualized. No evidence of pulmonic stenosis. Aorta: Aortic dilatation noted. There is borderline dilatation of the aortic root, measuring 38 mm. Venous: The inferior vena cava is normal in size with greater than 50% respiratory variability, suggesting right atrial pressure of 3 mmHg. IAS/Shunts: No atrial level shunt detected by color flow Doppler.  LEFT VENTRICLE PLAX 2D LVIDd:         4.90 cm LVIDs:         3.40 cm LV PW:         1.00 cm LV IVS:        1.10 cm LVOT diam:     2.30 cm LV SV:         85 LV SV Index:   41 LVOT Area:     4.15 cm  IVC IVC diam: 1.30 cm LEFT ATRIUM          Index LA diam:    3.10 cm 1.49 cm/m  AORTIC VALVE AV Area (Vmax): 4.05 cm AV Vmax:        120.00 cm/s AV Peak Grad:   5.8 mmHg LVOT Vmax:      117.00 cm/s LVOT Vmean:     77.900 cm/s LVOT VTI:       0.205 m  AORTA Ao Root diam: 3.80 cm Ao Asc diam:  3.40 cm TRICUSPID VALVE TR Peak grad:   33.4 mmHg TR Vmax:        289.00 cm/s  SHUNTS Systemic VTI:  0.20 m Systemic Diam: 2.30 cm Jules Oar Gilbert Electronically signed by Jules Oar Gilbert Signature Date/Time: 12/15/2023/5:46:08 PM    Final    DG Swallowing Func-Speech Pathology Result Date: 12/15/2023 Table formatting from the original result was not included. Modified Barium Swallow Study Patient Details Name: MAYRA PUHL MRN: [id number] Date of Birth: March 14, 1931 Today's Date: 12/15/2023 HPI/PMH: HPI: FRANKO ATHAS is a 89 y.o. male with past medical history  of hearing loss, vitreous hemorrhage,Hypertension, hypothyroidism, BPH, coming from home due to a fall.  Patient brought by EMS.  Patient uses a walker and hurt his left shoulder no reports of loss of consciousness.  And did not hit his head per report.  Patient is a limited historian.  Patient is part of the palliative care program with other healthcare and has home health services and physical therapy and home health aide. CT chest: Bibasilar consolidations with multiple calcifications, left greater than right, most likely due to inspissated calcified small airway disease such as due to chronic aspiration. Clinical Impression: Clinical Impression: Pt presents with mild oropharyngeal dysphagia characterized by slightly impaired timing and strength. Anterior hyoid movement and epiglottic inversion are complete but due to mistiming, laryngeal closure is incomplete. This allows quick moving thin liquids to enter the laryngeal vestibule and progress to the level of the vocal folds in trace quantities (PAS 4). Pt senses frank penetration and initiates throat clearance, which is effective at clearing  the majority of already trace penetrates. The amount that is left is expelled by subsequent swallows. Recommend he continue his current diet. SLP will f/u at least briefly to provide education and reinforce use of compensatory strategies. Factors that may increase risk of adverse event in presence of aspiration Roderick Civatte & Jessy Morocco 2021): Factors that may increase risk of adverse event in presence of aspiration Roderick Civatte & Jessy Morocco 2021): Limited mobility; Frail or deconditioned Recommendations/Plan: Swallowing Evaluation Recommendations Swallowing Evaluation Recommendations Recommendations: PO diet PO Diet Recommendation: Regular; Thin liquids (Level 0) Liquid Administration via: Cup; Straw Medication Administration: Whole meds with puree Supervision: Staff to assist with self-feeding; Full supervision/cueing for swallowing strategies Swallowing strategies  : Minimize environmental distractions; Slow rate; Small bites/sips; Clear throat intermittently Postural changes: Position pt fully upright for meals; Stay upright 30-60 min after meals Oral care recommendations: Oral care BID (2x/day) Treatment Plan Treatment Plan Treatment recommendations: Therapy as outlined in treatment plan below Follow-up recommendations: Skilled nursing-short term rehab (<3 hours/day) Functional status assessment: Patient has had a recent decline in their functional status and demonstrates the ability to make significant improvements in function in a reasonable and predictable amount of time. Treatment frequency: Min 2x/week Treatment duration: 1 week Interventions: Oropharyngeal exercises; Compensatory techniques; Patient/family education; Trials of upgraded texture/liquids; Diet toleration management by SLP Recommendations Recommendations for follow up therapy are one component of a multi-disciplinary discharge planning process, led by the attending physician.  Recommendations may be updated based on patient status, additional functional  criteria and insurance authorization. Assessment: Orofacial Exam: Orofacial Exam Oral Cavity: Oral Hygiene: WFL Oral Cavity - Dentition: Adequate natural dentition Orofacial Anatomy: WFL Oral Motor/Sensory Function: WFL Anatomy: Anatomy: Suspected cervical osteophytes Boluses Administered: Boluses Administered Boluses Administered: Thin liquids (Level 0); Mildly thick liquids (Level 2, nectar thick); Moderately thick liquids (Level 3, honey thick); Puree; Solid  Oral Impairment Domain: Oral Impairment Domain Lip Closure: No labial escape Tongue control during bolus hold: Cohesive bolus between tongue to palatal seal Bolus preparation/mastication: Timely and efficient chewing and mashing  Bolus transport/lingual motion: Brisk tongue motion Oral residue: Trace residue lining oral structures Location of oral residue : Tongue Initiation of pharyngeal swallow : Posterior angle of the ramus  Pharyngeal Impairment Domain: Pharyngeal Impairment Domain Soft palate elevation: No bolus between soft palate (SP)/pharyngeal wall (PW) Laryngeal elevation: Complete superior movement of thyroid cartilage with complete approximation of arytenoids to epiglottic petiole Anterior hyoid excursion: Complete anterior movement Epiglottic movement: Complete inversion Laryngeal vestibule closure: Complete, no air/contrast in laryngeal vestibule Pharyngeal stripping wave : Present - complete Pharyngeal contraction (A/P view only): N/A Pharyngoesophageal segment opening: Complete distension and complete duration, no obstruction of flow Tongue base retraction: Trace column of contrast or air between tongue base and PPW Pharyngeal residue: Trace residue within or on pharyngeal structures Location of pharyngeal residue: Tongue base; Valleculae  Esophageal Impairment Domain: No data recorded Pill: No data recorded Penetration/Aspiration Scale Score: Penetration/Aspiration Scale Score 1.  Material does not enter airway: Mildly thick liquids (Level 2,  nectar thick); Moderately thick liquids (Level 3, honey thick); Puree; Solid 5.  Material enters airway, CONTACTS cords and not ejected out: Thin liquids (Level 0) Compensatory Strategies: Compensatory Strategies Compensatory strategies: Yes Straw: Effective; Ineffective Effective Straw: Mildly thick liquid (Level 2, nectar thick) Ineffective Straw: Thin liquid (Level 0) Chin tuck: Effective Effective Chin Tuck: Thin liquid (Level 0)   General Information: Caregiver present: No  Diet Prior to this Study: Regular; Thin liquids (Level 0)   Temperature : Normal   Respiratory Status: WFL   Supplemental O2: None (Room air)   History of Recent Intubation: No  Behavior/Cognition: Alert; Cooperative; Pleasant mood; Confused Self-Feeding Abilities: Able to self-feed Baseline vocal quality/speech: Normal Volitional Cough: Able to elicit Volitional Swallow: Able to elicit Exam Limitations: No limitations Goal Planning: Prognosis for improved oropharyngeal function: Good Barriers to Reach Goals: Cognitive deficits No data recorded Patient/Family Stated Goal: none stated Consulted and agree with results and recommendations: Patient Pain: Pain Assessment Pain Assessment: Faces Faces Pain Scale: 8 Breathing: 0 Negative Vocalization: 1 Facial Expression: 1 Body Language: 1 Consolability: 0 PAINAD Score: 3 Pain Location: back when transferring Pain Descriptors / Indicators: Aching; Discomfort; Grimacing; Guarding; Moaning Pain Intervention(s): Monitored during session; Repositioned End of Session: Start Time:SLP Start Time (ACUTE ONLY): 1400 Stop Time: SLP Stop Time (ACUTE ONLY): 1412 Time Calculation:SLP Time Calculation (min) (ACUTE ONLY): 12 min Charges: SLP Evaluations $ SLP Speech Visit: 1 Visit SLP Evaluations $BSS Swallow: 1 Procedure $MBS Swallow: 1 Procedure SLP visit diagnosis: SLP Visit Diagnosis: Dysphagia, oropharyngeal phase (R13.12) Past Medical History: Past Medical History: Diagnosis Date  Arthritis   Enlarged  prostate   Falls 06/27/2019  last fall was 2 weeks ago   Heart murmur   History of kidney stones   only one as a young man , passed independently   HTN (hypertension)  Past Surgical History: Past Surgical History: Procedure Laterality Date  BACK SURGERY    total 6 surgeries  . inluding discectomy, fusion with harvest from right hip", rods in place   TOTAL KNEE ARTHROPLASTY Left 07/02/2019  Procedure: Left Knee Arthroplasty;  Surgeon: Wendolyn Hamburger, [place];  Location: [facility];  Service: Orthopedics;  Laterality: Left; Amil Kale, M.A., CCC-SLP Speech Language Pathology, Acute Rehabilitation Services Secure Chat preferred [phone] 12/15/2023, 2:45 PM  CT Angio Chest Pulmonary Embolism (PE) W or WO Contrast Result Date: 12/14/2023 CLINICAL DATA:  Fall injury earlier today, now with suspected pulmonary embolism, high probability. There is ribcage pain, hypertension, and abnormal D-dimer. EXAM: CT ANGIOGRAPHY CHEST WITH  CONTRAST TECHNIQUE: Multidetector CT imaging of the chest was performed using the standard protocol during bolus administration of intravenous contrast. Multiplanar CT image reconstructions and MIPs were obtained to evaluate the vascular anatomy. RADIATION DOSE REDUCTION: This exam was performed according to the departmental dose-optimization program which includes automated exposure control, adjustment of the mA and/or kV according to patient size and/or use of iterative reconstruction technique. CONTRAST:  65mL OMNIPAQUE  IOHEXOL  350 MG/ML SOLN COMPARISON:  Last chest x-ray was PA and lateral 06/27/2019. No prior cross-sectional imaging for comparison. He had a lumbar spine CT earlier today but this did not include the chest. FINDINGS: Cardiovascular: There is mild panchamber cardiomegaly. No evidence of right heart strain. There are left main and patchy three-vessel coronary artery calcifications and a small pericardial effusion anteriorly. Pulmonary arteries are adequately opacified and free of  thrombus at least to the segmental level. The subsegmental arteries are largely obscured by breathing motion and not well seen. Subsegmental emboli could be missed. The the aorta is tortuous with moderate patchy calcific plaques, scattered calcification in the great vessels. There is no aneurysm, stenosis or dissection.  No venous dilatation. Mediastinum/Nodes: There is a mildly patulous esophagus with normal wall thickness. Query chronic dysmotility. The thoracic trachea and both main bronchi are patent. No thyroid or axillary mass is seen. There is no intrathoracic adenopathy. Lungs/Pleura: There are bilateral layering trace pleural effusions. No pneumothorax or hemothorax. There is a small posterior basal consolidation in the left lower lobe with multiple punctate and curvilinear calcifications within the consolidation, extending medially adjacent the left hemidiaphragm. This is most likely chronic consolidation due to inspissated calcified small airway disease such as due to chronic aspiration. superimposed active infection is not strictly excluded. This is further supported by the observation of volume loss in the left lower lobe base. There is similar atelectasis with calcifications although over a smaller area, in the right lower lobe base. No focal consolidations are noted elsewhere. There is a 5 mm nodule anteriorly in the base of the right upper lobe on 7:60. There are no other appreciable nodules. Upper Abdomen: Moderate hepatic steatosis. Scattered punctate nonobstructive caliceal stones upper pole left kidney. No acute upper abdominal findings. Abdominal aortic atherosclerosis Musculoskeletal: There are bridging syndesmophytes and bridging enthesopathy throughout the thoracic spine, osteopenia, multilevel collapsed and ankylosed disc spaces. No acute compression fracture is seen. The ribcage is grossly intact. There is moderate bilateral gynecomastia with no other focal chest wall abnormality. There is  moderately advanced bilateral shoulder arthrosis, and on the left there is acromiohumeral abutment consistent with chronic rotator cuff arthropathy with a likely degenerative tear. Review of the MIP images confirms the above findings. IMPRESSION: 1. No evidence of arterial thrombosis at least to the segmental level. The subsegmental arteries are largely obscured by breathing motion and not well seen. Subsegmental emboli could be missed. 2. Cardiomegaly with small pericardial effusion. 3. Aortic and coronary artery atherosclerosis. 4. Trace bilateral pleural effusions. 5. Bibasilar consolidations with multiple calcifications, left greater than right, most likely due to inspissated calcified small airway disease such as due to chronic aspiration. Superimposed active infection is not strictly excluded. No consolidation is seen elsewhere. 6. 5 mm nodule anteriorly in the base of the right upper lobe. Per Fleischner Society Guidelines, if patient is low risk for malignancy, no routine follow-up imaging is recommended. If patient is high risk for malignancy, a non-contrast Chest CT at 12 months is optional. If performed and the nodule is stable at 12 months, no  further follow-up is recommended. These guidelines do not apply to immunocompromised patients and patients with cancer. Follow up in patients with significant comorbidities as clinically warranted. For lung cancer screening, adhere to Lung-RADS guidelines. Reference: Radiology. 2017; 284(1):228-43. 7. Hepatic steatosis. 8. Nonobstructive left micronephrolithiasis. 9. Osteopenia and degenerative change. No acute compression fracture is seen. 10. Gynecomastia. Aortic Atherosclerosis (ICD10-I70.0). Electronically Signed   By: Denman Fischer M.D.   On: 12/14/2023 21:42   CT Lumbar Spine Wo Contrast Result Date: 12/14/2023 CLINICAL DATA:  Lumbar radiculopathy, fall with severe lower back pain. EXAM: CT LUMBAR SPINE WITHOUT CONTRAST TECHNIQUE: Multidetector CT imaging  of the lumbar spine was performed without intravenous contrast administration. Multiplanar CT image reconstructions were also generated. RADIATION DOSE REDUCTION: This exam was performed according to the departmental dose-optimization program which includes automated exposure control, adjustment of the mA and/or kV according to patient size and/or use of iterative reconstruction technique. COMPARISON:  MRI lumbar spine 12/14/2023. FINDINGS: Segmentation: 5 lumbar type vertebrae. Alignment: Straightening of the normal lumbar lordosis. Trace retrolisthesis of L5 relative to S1. Widening of the L5-S1 disc space corresponding to findings of large fluid-filled cleft on MRI. Vertebrae: Ankylosis at the T11-T12 level. Additional ankylosis from L1-2 L5. T12-L1 level remains mobile. Diffuse osteopenia. No evidence of compression fracture or displaced fracture in the lumbar spine. Limited evaluation for nondisplaced fracture given the degree of osteopenia. There is slight irregularity along the anterior cortex of S1 which may reflect mildly displaced fracture. Ankylosis of the bilateral sacroiliac joints. Paraspinal and other soft tissues: Atrophy of the paraspinal musculature. The visualized paraspinal soft tissues are otherwise unremarkable. Moderate atherosclerosis of the abdominal aorta and branch vessels. Moderately distended urinary bladder with irregular contour which may reflect multiple bladder diverticuli. Disc levels: Moderate disc space narrowing at T12-L1 with degenerative endplate changes and prominent endplate osteophytes. Disc bulge and posterior osteophytes along with facet arthrosis resulting in moderate spinal canal stenosis. There is mild-to-moderate osseous foraminal stenosis on the right. Additional disc bulges and posterior osteophytes at multiple levels in the lumbar spine from L1-2 to L4-5 without high-grade osseous spinal canal stenosis. No high-grade osseous foraminal stenosis. No evidence of  high-grade stenosis at L5-S1. Prominent facet overgrowth on the right at L5-S1 resulting in moderate foraminal narrowing. IMPRESSION: Irregularity of the anterior cortex of S1 which may reflect mildly displaced fracture. No significant retropulsion. No evidence of acute traumatic injury at T12-L1. Degenerative changes of the lumbar spine as above. Disc bulge and prominent osteophytes at T12-L1 resulting in moderate osseous spinal canal stenosis. Moderate osseous foraminal stenosis on the right at L5-S1. Redemonstrated fluid cleft at the L5-S1 disc space which is better evaluated on same day MRI. Moderately distended urinary bladder with irregular contour which may reflect multiple bladder diverticuli. Electronically Signed   By: Denny Flack M.D.   On: 12/14/2023 12:27   MR LUMBAR SPINE WO CONTRAST Result Date: 12/14/2023 CLINICAL DATA:  Ataxia. Fell. Low back pain and bilateral hip pain. EXAM: MRI LUMBAR SPINE WITHOUT CONTRAST TECHNIQUE: Multiplanar, multisequence MR imaging of the lumbar spine was performed. No intravenous contrast was administered. COMPARISON:  Radiography same day. MRI 10/16/2008. CT pelvis 09/23/2008. FINDINGS: Segmentation: 5 lumbar type vertebral bodies as numbered previously. Alignment:  No malalignment presently. Vertebrae: There is ankylosis of the spine in the lower thoracic region from inferior T10 through T12. T12-L1 remains a mobile level. There is ankylosis from L1 through L4. There is a large fluid-filled cleft at the L5 level the relates in some  way to an old fracture at the L5 level. Chronic and possibly acute fracture lines extend through the posterior elements at this level. Present I think many of the findings relate to the old fracture with chronic nonunion, I cannot exclude an element of acute injury at this level and think that a lumbar CT scan would be useful to correlate with this exam. Conus medullaris and cauda equina: Conus extends to the T12-L1 level. See below  regarding stenosis at this level. Conus and cauda equina appear otherwise normal. Paraspinal and other soft tissues: Negative Disc levels: T12-L1: This remains a mobile level. There is disc degeneration with loss of disc height. There are circumferential endplate osteophytes. The facets show arthropathy with facet and ligamentous hypertrophy. There is spinal stenosis at this level that could have compressive effect upon the conus tip and nerve roots of the cauda equina. I do not see an acute traumatic finding at this level but would suggest this be evaluated with CT. L1 through L4: Chronic ankylosis with sufficient patency of the canal and foramina. At L5, there is a large fluid-filled cleft measuring up to 2.3 cm in height. I suspect that this is chronic sequela of an old nonunited fracture at L5. There appears to be chronic extension through the posterior elements at this level in there could be ongoing motion. I would recommend a CT scan of this area as it is impossible to rule out some acute injury superimposed on what are probably largely chronic findings. IMPRESSION: 1. Underlying ankylosing disease of the spine. 2. T12-L1 remains a mobile level. There is disc degeneration with loss of disc height and circumferential endplate osteophytes. There is facet and ligamentous hypertrophy. There is spinal stenosis at this level that could have compressive effect upon the conus tip and nerve roots of the cauda equina. I do not see an acute traumatic finding at this level but would suggest this be evaluated with CT. 3. At L5, there is a large fluid-filled cleft measuring up to 2.3 cm in height. I suspect that this is chronic sequela of an old nonunited fracture at L5. There appears to be chronic extension through the posterior elements at this level and there could be ongoing motion. I would recommend a CT scan of this area as it is impossible to exclude some acute injury superimposed on what are probably largely  chronic findings. Electronically Signed   By: Bettylou Brunner M.D.   On: 12/14/2023 11:01   MR HIP RIGHT WO CONTRAST Result Date: 12/14/2023 CLINICAL DATA:  Fall. EXAM: MR OF THE RIGHT HIP WITHOUT CONTRAST TECHNIQUE: Multiplanar, multisequence MR imaging was performed. No intravenous contrast was administered. COMPARISON:  Hip radiographs dated 12/14/2023 at 7:08 a.m. FINDINGS: Examination is significantly limited due to motion degradation. Soft tissue and Muscle: There is no significant focal soft tissue swelling.Hamstring tendon origins are intact.Gluteal cuff tendon insertions are intact.Moderate-to-large diverticulum at the right superior bladder dome with additional smaller scattered diverticula and trabecular appearance of the bladder. Bones: No acute osseous abnormality of the pelvis or bilateral proximal femora. The bone marrow signal intensity is within normal limits. Partially evaluated ankylosis of the lumbar spine with cleft like defect again noted at the L5 level, likely related to sequela of prior L5 fracture. Please refer to the same-day MRI of the lumbar spine for description of lumbar spine findings. Hip: The hip joint is anatomically aligned. Mild superolateral joint space narrowing and marginal osteophytosis.The labrum is grossly intact, although evaluation is limited by lack  of intra-articular fluid/contrast.No hip joint effusion. Ligamentum teres and transverse ligaments are intact. IMPRESSION: Evaluation is limited due to motion degradation. 1. No acute osseous abnormality. 2. Partially evaluated ankylosis of the lumbar spine with cleft like defect again noted at the L5 level, likely related to sequela of prior L5 fracture. Please refer to the same-day MRI of the lumbar spine for description of lumbar spine findings. Electronically Signed   By: Mannie Seek M.D.   On: 12/14/2023 11:00   CT Head Wo Contrast Addendum Date: 12/14/2023 ADDENDUM REPORT: 12/14/2023 07:53 ADDENDUM: Not  mentioned above, there is soft tissue thickening right of the midline along right ocular retinal layer. This was seen previously but clinical significance indeterminate despite lack of interval change. This could simply be under old retinal hemorrhage but other etiologies are possible. Detailed ophthalmological or optometric exam is recommended. Electronically Signed   By: Denman Fischer M.D.   On: 12/14/2023 07:53   Result Date: 12/14/2023 CLINICAL DATA:  Head and neck trauma.  Fall injury. EXAM: CT HEAD WITHOUT CONTRAST CT CERVICAL SPINE WITHOUT CONTRAST TECHNIQUE: Multidetector CT imaging of the head and cervical spine was performed following the standard protocol without intravenous contrast. Multiplanar CT image reconstructions of the cervical spine were also generated. RADIATION DOSE REDUCTION: This exam was performed according to the departmental dose-optimization program which includes automated exposure control, adjustment of the mA and/or kV according to patient size and/or use of iterative reconstruction technique. COMPARISON:  CT scan head and cervical spine both 05/29/2023 FINDINGS: CT HEAD FINDINGS Brain: There is moderate to severe cerebral atrophy, atrophic ventriculomegaly, and small-vessel disease with mild cerebellar atrophy. No cortical based evolving infarct, hemorrhage, mass or mass effect are seen. There is no midline shift.  Basal cisterns are clear. Vascular: There are patchy calcifications in both siphons. No hyperdense central vessels. Skull: Osteopenia. Negative for fractures or focal lesions. A metal BB again noted imbedded in the soft tissues of the anterior right cheek. Sinuses/Orbits: No acute finding. Other: None. CT CERVICAL SPINE FINDINGS Alignment: There is chronic slight cervical kyphodextroscoliosis, minimal chronic grade 1 anterolisthesis likely degenerative again noted C3-4, C7-T1, T1-2. Skull base and vertebrae: Patient motion limits fine detail particularly on the sagittal  and coronal reformats. No compression fracture is seen or obvious displaced fracture. There is osteopenia. No focal pathologic process is seen through the motion artifacts. Soft tissues and spinal canal: No prevertebral fluid or swelling. No visible canal hematoma. There is carotid atherosclerosis. No thyroid mass. Disc levels: The discs are diffusely degenerated and collapsed with bidirectional endplate spurring. There is ankylosis across the left facet joints C2-4, multilevel interbody ankylosis via anterior bridging osteophytes. At C4-5, posterior disc osteophyte complex again compresses the left hemicord and the thecal sac is 5.5 mm AP. Other levels do not show spondylotic cord compression, but there is again noted multilevel severe degenerative foraminal stenosis related to old facet hypertrophy, uncinate spurring. This is most severe from C3-4 through C5-6 although is not as well depicted given patient motion. Upper chest: Negative. Other: None. IMPRESSION: 1. No acute intracranial CT findings or depressed skull fractures. 2. Osteopenia and degenerative change of the cervical spine without evidence of fractures or traumatic listhesis. Patient motion limits fine detail. 3. Chronic slight cervical kyphodextroscoliosis and minimal chronic grade 1 anterolisthesis C3-4, C7-T1, T1-2. 4. Multilevel severe degenerative foraminal stenosis. 5. 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 6. Carotid atherosclerosis. 7. Metal BB imbedded in the soft tissues of the anterior right cheek, seen previously. Electronically Signed:  By: Denman Fischer M.D. On: 12/14/2023 07:15   CT Cervical Spine Wo Contrast Addendum Date: 12/14/2023 ADDENDUM REPORT: 12/14/2023 07:53 ADDENDUM: Not mentioned above, there is soft tissue thickening right of the midline along right ocular retinal layer. This was seen previously but clinical significance indeterminate despite lack of interval change. This could simply be under old retinal hemorrhage  but other etiologies are possible. Detailed ophthalmological or optometric exam is recommended. Electronically Signed   By: Denman Fischer M.D.   On: 12/14/2023 07:53   Result Date: 12/14/2023 CLINICAL DATA:  Head and neck trauma.  Fall injury. EXAM: CT HEAD WITHOUT CONTRAST CT CERVICAL SPINE WITHOUT CONTRAST TECHNIQUE: Multidetector CT imaging of the head and cervical spine was performed following the standard protocol without intravenous contrast. Multiplanar CT image reconstructions of the cervical spine were also generated. RADIATION DOSE REDUCTION: This exam was performed according to the departmental dose-optimization program which includes automated exposure control, adjustment of the mA and/or kV according to patient size and/or use of iterative reconstruction technique. COMPARISON:  CT scan head and cervical spine both 05/29/2023 FINDINGS: CT HEAD FINDINGS Brain: There is moderate to severe cerebral atrophy, atrophic ventriculomegaly, and small-vessel disease with mild cerebellar atrophy. No cortical based evolving infarct, hemorrhage, mass or mass effect are seen. There is no midline shift.  Basal cisterns are clear. Vascular: There are patchy calcifications in both siphons. No hyperdense central vessels. Skull: Osteopenia. Negative for fractures or focal lesions. A metal BB again noted imbedded in the soft tissues of the anterior right cheek. Sinuses/Orbits: No acute finding. Other: None. CT CERVICAL SPINE FINDINGS Alignment: There is chronic slight cervical kyphodextroscoliosis, minimal chronic grade 1 anterolisthesis likely degenerative again noted C3-4, C7-T1, T1-2. Skull base and vertebrae: Patient motion limits fine detail particularly on the sagittal and coronal reformats. No compression fracture is seen or obvious displaced fracture. There is osteopenia. No focal pathologic process is seen through the motion artifacts. Soft tissues and spinal canal: No prevertebral fluid or swelling. No visible  canal hematoma. There is carotid atherosclerosis. No thyroid mass. Disc levels: The discs are diffusely degenerated and collapsed with bidirectional endplate spurring. There is ankylosis across the left facet joints C2-4, multilevel interbody ankylosis via anterior bridging osteophytes. At C4-5, posterior disc osteophyte complex again compresses the left hemicord and the thecal sac is 5.5 mm AP. Other levels do not show spondylotic cord compression, but there is again noted multilevel severe degenerative foraminal stenosis related to old facet hypertrophy, uncinate spurring. This is most severe from C3-4 through C5-6 although is not as well depicted given patient motion. Upper chest: Negative. Other: None. IMPRESSION: 1. No acute intracranial CT findings or depressed skull fractures. 2. Osteopenia and degenerative change of the cervical spine without evidence of fractures or traumatic listhesis. Patient motion limits fine detail. 3. Chronic slight cervical kyphodextroscoliosis and minimal chronic grade 1 anterolisthesis C3-4, C7-T1, T1-2. 4. Multilevel severe degenerative foraminal stenosis. 5. 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 6. Carotid atherosclerosis. 7. Metal BB imbedded in the soft tissues of the anterior right cheek, seen previously. Electronically Signed: By: Denman Fischer M.D. On: 12/14/2023 07:15   DG Lumbar Spine Complete Result Date: 12/14/2023 CLINICAL DATA:  89 year old male status post fall with soft tissue injury, abrasion, skin tear. Pain. EXAM: LUMBAR SPINE - COMPLETE 4+ VIEW COMPARISON:  Lumbar radiographs 11/04/2009. FINDINGS: Aortoiliac calcified atherosclerosis. Nonobstructed visible bowel gas pattern. Tortuous abdominal aorta. Osteopenia. Chronic lumbar spinal ankylosis, with chronic non fusion of what appears to be the T12-L1  level and bulky chronic disc and endplate degeneration there which has progressed since 2011, along with vacuum disc. Visible lower thoracic levels thin  through T12 also appear ankylosed. Maintained vertebral height. No acute osseous abnormality identified. IMPRESSION: 1. 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 No acute osseous abnormality identified. 2.  Aortic Atherosclerosis (ICD10-I70.0). Electronically Signed   By: Marlise Morse M.D.   On: 12/14/2023 07:31   DG Hips Bilat W or Wo Pelvis 3-4 Views Result Date: 12/14/2023 CLINICAL DATA:  89 year old male status post fall with soft tissue injury, abrasion, skin tear. Pain. Pelvis and right hip series 09/23/2008. EXAM: DG HIP (WITH OR WITHOUT PELVIS) 3-4V BILAT COMPARISON:  None Available. FINDINGS: Five views at 0649 hours. Tortuous and calcified distal abdominal aorta, aortoiliac bifurcation. Pelvis osteopenia. Femoral heads remain normally located. No pelvis fracture identified. SI joints appear symmetric, pubic symphysis within normal limits. No proximal right femur fracture identified. Proximal femurs appear stable compared to 2010. Nonobstructed bowel-gas pattern. IMPRESSION: 1. Osteopenia. No acute fracture or dislocation identified about the bilateral hips or pelvis. If occult hip fracture is suspected or if the patient is unable to weightbear, MRI is the preferred modality for further evaluation. 2.  Aortic Atherosclerosis (ICD10-I70.0). Electronically Signed   By: Marlise Morse M.D.   On: 12/14/2023 07:29   DG Shoulder Left Result Date: 12/14/2023 CLINICAL DATA:  89 year old male status post fall with soft tissue injury, abrasion, skin tear. Pain. EXAM: LEFT SHOULDER - 2+ VIEW COMPARISON:  Chest radiographs 06/27/2019. FINDINGS: Four views at 0701 hours. No glenohumeral joint dislocation. Superior subluxation of the left humeral head, but compared to the right side today there is mild to moderate left glenohumeral joint space loss and spurring. Proximal left humerus appears intact. No fracture of the  left clavicle or scapula identified. Left rib osteopenia. No acute fracture of the visible left ribs identified. Negative visible left lung parenchyma. Calcified aortic atherosclerosis. IMPRESSION: No acute fracture or dislocation identified about the left shoulder. Electronically Signed   By: Marlise Morse M.D.   On: 12/14/2023 07:27   DG Shoulder Right Result Date: 12/14/2023 CLINICAL DATA:  89 year old male status post fall with soft tissue injury, abrasion, skin tear. Pain. EXAM: RIGHT SHOULDER - 2+ VIEW COMPARISON:  Chest radiographs 06/27/2019. FINDINGS: Three views at 0702 hours. Maintained glenohumeral joint alignment with superior subluxation of the humeral head, severe glenohumeral joint space loss, subchondral sclerosis, spurring. Proximal right humerus appears intact. No right clavicle or scapula fracture identified. Visible right ribs and lung appear negative. IMPRESSION: 1. No acute fracture or dislocation identified about the right shoulder. 2. 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 Electronically Signed   By: Marlise Morse M.D.   On: 12/14/2023 07:26     Time coordinating discharge: Over 30 minutes    [name], Gilbert  [facility] 12/23/2023, 11:16 AM

## 2023-12-26 NOTE — Care Management Important Message (Signed)
 Important Message  Patient Details  Name: Gilbert Morse MRN: [id number] Date of Birth: Jul 24, 1931   Important Message Given:  Yes - Medicare IM  Patient left prior to IM delivery will mail a copy of the IM to the patient home address.     [name] 12/26/2023, 9:48 AM

## 2024-01-15 DEATH — deceased
